# Patient Record
Sex: Female | Born: 1950
Health system: Southern US, Community
[De-identification: ages and names within clinical notes are randomized; demographics above are authoritative.]

## PROBLEM LIST (undated history)

## (undated) DIAGNOSIS — F17200 Nicotine dependence, unspecified, uncomplicated: Secondary | ICD-10-CM

## (undated) DIAGNOSIS — J449 Chronic obstructive pulmonary disease, unspecified: Secondary | ICD-10-CM

## (undated) DIAGNOSIS — F39 Unspecified mood [affective] disorder: Secondary | ICD-10-CM

## (undated) HISTORY — DX: Unspecified mood (affective) disorder: F39

## (undated) HISTORY — DX: Nicotine dependence, unspecified, uncomplicated: F17.200

---

## 1998-01-11 ENCOUNTER — Emergency Department (HOSPITAL_COMMUNITY): Admission: EM | Admit: 1998-01-11 | Discharge: 1998-01-11 | Payer: Self-pay | Admitting: Emergency Medicine

## 1998-01-11 ENCOUNTER — Emergency Department (HOSPITAL_COMMUNITY): Admission: EM | Admit: 1998-01-11 | Discharge: 1998-01-12 | Payer: Self-pay | Admitting: Family Medicine

## 1999-03-07 ENCOUNTER — Emergency Department (HOSPITAL_COMMUNITY): Admission: EM | Admit: 1999-03-07 | Discharge: 1999-03-07 | Payer: Self-pay | Admitting: *Deleted

## 2003-02-13 ENCOUNTER — Encounter: Admission: RE | Admit: 2003-02-13 | Discharge: 2003-02-13 | Payer: Self-pay | Admitting: Family Medicine

## 2003-11-14 ENCOUNTER — Other Ambulatory Visit (HOSPITAL_COMMUNITY): Admission: RE | Admit: 2003-11-14 | Discharge: 2003-11-28 | Payer: Self-pay | Admitting: Psychiatry

## 2004-02-01 ENCOUNTER — Emergency Department (HOSPITAL_COMMUNITY): Admission: EM | Admit: 2004-02-01 | Discharge: 2004-02-01 | Payer: Self-pay | Admitting: Emergency Medicine

## 2006-03-11 ENCOUNTER — Ambulatory Visit: Payer: Self-pay | Admitting: Family Medicine

## 2006-05-05 ENCOUNTER — Ambulatory Visit: Payer: Self-pay | Admitting: Family Medicine

## 2006-10-26 ENCOUNTER — Ambulatory Visit: Payer: Self-pay | Admitting: Family Medicine

## 2007-08-02 ENCOUNTER — Ambulatory Visit: Payer: Self-pay | Admitting: Family Medicine

## 2007-12-18 ENCOUNTER — Ambulatory Visit: Payer: Self-pay | Admitting: Family Medicine

## 2008-07-18 ENCOUNTER — Ambulatory Visit: Payer: Self-pay | Admitting: Family Medicine

## 2009-01-06 ENCOUNTER — Ambulatory Visit: Payer: Self-pay | Admitting: Family Medicine

## 2009-03-19 ENCOUNTER — Ambulatory Visit: Payer: Self-pay | Admitting: Family Medicine

## 2009-04-15 ENCOUNTER — Encounter: Admission: RE | Admit: 2009-04-15 | Discharge: 2009-04-15 | Payer: Self-pay | Admitting: Family Medicine

## 2009-04-15 ENCOUNTER — Ambulatory Visit: Payer: Self-pay | Admitting: Family Medicine

## 2009-06-02 ENCOUNTER — Ambulatory Visit: Payer: Self-pay | Admitting: Family Medicine

## 2010-02-24 ENCOUNTER — Ambulatory Visit
Admission: RE | Admit: 2010-02-24 | Discharge: 2010-02-24 | Payer: Self-pay | Source: Home / Self Care | Attending: Family Medicine | Admitting: Family Medicine

## 2010-11-04 ENCOUNTER — Encounter: Payer: Self-pay | Admitting: Family Medicine

## 2011-05-31 ENCOUNTER — Ambulatory Visit (INDEPENDENT_AMBULATORY_CARE_PROVIDER_SITE_OTHER): Payer: Medicare Other | Admitting: Family Medicine

## 2011-05-31 ENCOUNTER — Encounter: Payer: Self-pay | Admitting: Family Medicine

## 2011-05-31 DIAGNOSIS — J45909 Unspecified asthma, uncomplicated: Secondary | ICD-10-CM | POA: Diagnosis not present

## 2011-05-31 DIAGNOSIS — F17200 Nicotine dependence, unspecified, uncomplicated: Secondary | ICD-10-CM

## 2011-05-31 DIAGNOSIS — J209 Acute bronchitis, unspecified: Secondary | ICD-10-CM | POA: Diagnosis not present

## 2011-05-31 DIAGNOSIS — Z720 Tobacco use: Secondary | ICD-10-CM | POA: Insufficient documentation

## 2011-05-31 MED ORDER — ALBUTEROL SULFATE HFA 108 (90 BASE) MCG/ACT IN AERS: 2.0000 | INHALATION_SPRAY | Freq: Four times a day (QID) | RESPIRATORY_TRACT | Status: DC | PRN

## 2011-05-31 MED ORDER — AMOXICILLIN 875 MG PO TABS: 875.0000 mg | ORAL_TABLET | Freq: Two times a day (BID) | ORAL | Status: AC

## 2011-05-31 NOTE — Patient Instructions (Signed)
Call if not entirely better at the end of the antibiotic 

## 2011-05-31 NOTE — Progress Notes (Signed)
  Subjective:    Patient ID: Jessica Townsend, female    DOB: 04-25-1950, 61 y.o.   MRN: [id number]  HPI She complains of a one-week history of right-sided neck swelling that went diminished quite quickly as well as some ringing in her ears. She also has had difficulty with an intermittently productive cough and questionable fever and chills. She smokes one or 2 cigarettes per day per her history. She continues on Symbicort.   Review of Systems     Objective:   Physical Exam alert and in no distress. Tympanic membranes and canals are normal. Throat is clear. Tonsils are normal. Neck is supple without adenopathy or thyromegaly. Cardiac exam shows a regular sinus rhythm without murmurs or gallops. Lungs show scattered expiratory wheezing        Assessment & Plan:   1. Asthma  albuterol (PROVENTIL HFA;VENTOLIN HFA) 108 (90 BASE) MCG/ACT inhaler  2. Bronchitis, acute  amoxicillin (AMOXIL) 875 MG tablet  3. Current smoker     again encouraged her to quit smoking.

## 2011-09-03 ENCOUNTER — Other Ambulatory Visit: Payer: Self-pay | Admitting: Medical

## 2011-09-03 ENCOUNTER — Telehealth: Payer: Self-pay | Admitting: Medical

## 2011-09-03 MED ORDER — ALBUTEROL SULFATE HFA 108 (90 BASE) MCG/ACT IN AERS: 2.0000 | INHALATION_SPRAY | Freq: Four times a day (QID) | RESPIRATORY_TRACT | Status: DC | PRN

## 2011-09-03 NOTE — Telephone Encounter (Signed)
inahler rx sent.  If using on a regular basis, more than 2 x per week, needs to come in to discuss other treatment and better control of asthma.

## 2011-09-03 NOTE — Telephone Encounter (Signed)
I lmom notying the patien that her meds. Was sent to the pharmacy and she will need to contact us if asthma is not under control. CLS

## 2011-11-04 ENCOUNTER — Ambulatory Visit (INDEPENDENT_AMBULATORY_CARE_PROVIDER_SITE_OTHER): Payer: Medicare Other | Admitting: Family Medicine

## 2011-11-04 ENCOUNTER — Encounter: Payer: Self-pay | Admitting: Family Medicine

## 2011-11-04 VITALS — BP 120/70 | HR 64 | Wt 154.0 lb

## 2011-11-04 DIAGNOSIS — F17200 Nicotine dependence, unspecified, uncomplicated: Secondary | ICD-10-CM

## 2011-11-04 DIAGNOSIS — J45909 Unspecified asthma, uncomplicated: Secondary | ICD-10-CM

## 2011-11-04 DIAGNOSIS — Z23 Encounter for immunization: Secondary | ICD-10-CM

## 2011-11-04 MED ORDER — ALBUTEROL SULFATE HFA 108 (90 BASE) MCG/ACT IN AERS: 2.0000 | INHALATION_SPRAY | Freq: Four times a day (QID) | RESPIRATORY_TRACT | Status: DC | PRN

## 2011-11-04 MED ORDER — BUDESONIDE-FORMOTEROL FUMARATE 160-4.5 MCG/ACT IN AERO: 2.0000 | INHALATION_SPRAY | Freq: Two times a day (BID) | RESPIRATORY_TRACT | Status: DC

## 2011-11-04 NOTE — Progress Notes (Signed)
  Subjective:    Patient ID: Jessica Townsend, female    DOB: October 20, 1950, 61 y.o.   MRN: [id number]  HPI She is here for medication refill. She continues on her asthma medications. She also continues to smoke and has no desire to quit. She does base going outside on whether patterns.   Review of Systems     Objective:   Physical Exam alert and in no distress. Tympanic membranes and canals are normal. Throat is clear. Tonsils are normal. Neck is supple without adenopathy or thyromegaly. Cardiac exam shows a regular sinus rhythm without murmurs or gallops. Lungs are clear to auscultation.        Assessment & Plan:   1. Asthma  albuterol (PROAIR HFA) 108 (90 BASE) MCG/ACT inhaler, budesonide-formoterol (SYMBICORT) 160-4.5 MCG/ACT inhaler  2. Smoker     I again encouraged her to quit smoking.

## 2011-11-04 NOTE — Patient Instructions (Signed)
Quit smoking damn it!

## 2012-03-13 ENCOUNTER — Ambulatory Visit: Payer: Medicare Other | Admitting: Family Medicine

## 2012-05-04 ENCOUNTER — Encounter: Payer: Self-pay | Admitting: Family Medicine

## 2012-05-04 ENCOUNTER — Ambulatory Visit (INDEPENDENT_AMBULATORY_CARE_PROVIDER_SITE_OTHER): Payer: Medicare Other | Admitting: Family Medicine

## 2012-05-04 VITALS — HR 80 | Temp 98.3°F

## 2012-05-04 DIAGNOSIS — F17200 Nicotine dependence, unspecified, uncomplicated: Secondary | ICD-10-CM

## 2012-05-04 DIAGNOSIS — J45909 Unspecified asthma, uncomplicated: Secondary | ICD-10-CM

## 2012-05-04 DIAGNOSIS — J209 Acute bronchitis, unspecified: Secondary | ICD-10-CM | POA: Diagnosis not present

## 2012-05-04 MED ORDER — AMOXICILLIN-POT CLAVULANATE 875-125 MG PO TABS: 1.0000 | ORAL_TABLET | Freq: Two times a day (BID) | ORAL | Status: AC

## 2012-05-04 NOTE — Progress Notes (Signed)
  Subjective:    Patient ID: Jessica Townsend, female    DOB: 09/05/1950, 62 y.o.   MRN: [id number]  HPI She has a one-week history this started with swollen lymph nodes, sinus pressure and pain chest congestion with cough that has become productive with chills but no fever or sore throat or earache. She continues to smoke.   Review of Systems     Objective:   Physical Exam alert and in no distress. Tympanic membranes and canals are normal. Throat is clear. Tonsils are normal. Neck is supple without adenopathy or thyromegaly. Cardiac exam shows a regular sinus rhythm without murmurs or gallops. Lungs are clear to auscultation.        Assessment & Plan:  Acute bronchitis - Plan: amoxicillin-clavulanate (AUGMENTIN) 875-125 MG per tablet  Current smoker  Asthma I again encouraged her to quit smoking however she is not interested

## 2012-07-05 DIAGNOSIS — H04129 Dry eye syndrome of unspecified lacrimal gland: Secondary | ICD-10-CM | POA: Diagnosis not present

## 2012-07-05 DIAGNOSIS — H2510 Age-related nuclear cataract, unspecified eye: Secondary | ICD-10-CM | POA: Diagnosis not present

## 2012-07-05 DIAGNOSIS — H18599 Other hereditary corneal dystrophies, unspecified eye: Secondary | ICD-10-CM | POA: Diagnosis not present

## 2012-11-14 ENCOUNTER — Other Ambulatory Visit: Payer: Self-pay | Admitting: Family Medicine

## 2012-11-14 NOTE — Telephone Encounter (Signed)
Is this okay to refill? 

## 2012-12-17 ENCOUNTER — Other Ambulatory Visit: Payer: Self-pay | Admitting: Family Medicine

## 2013-02-19 ENCOUNTER — Encounter: Payer: Self-pay | Admitting: Family Medicine

## 2013-02-19 ENCOUNTER — Ambulatory Visit (INDEPENDENT_AMBULATORY_CARE_PROVIDER_SITE_OTHER): Payer: Medicare Other | Admitting: Family Medicine

## 2013-02-19 VITALS — BP 120/80 | HR 67 | Wt 149.0 lb

## 2013-02-19 DIAGNOSIS — B349 Viral infection, unspecified: Secondary | ICD-10-CM

## 2013-02-19 DIAGNOSIS — B9789 Other viral agents as the cause of diseases classified elsewhere: Secondary | ICD-10-CM

## 2013-02-19 NOTE — Progress Notes (Signed)
   Subjective:    Patient ID: Jessica Townsend, female    DOB: [birth year], 62 y.o.   MRN: [id number]  HPI She has a one-week history of right neck discomfort headache, fatigue, myalgias no sore throat, earache, coughing or chest congestion. She continues to smoke and is not interested in quitting.   Review of Systems     Objective:   Physical Exam alert and in no distress. Tympanic membranes and canals are normal. Throat is clear. Tonsils are normal. Neck is supple without adenopathy or thyromegaly. Cardiac exam shows a regular sinus rhythm without murmurs or gallops. Lungs are clear to auscultation.        Assessment & Plan:  Viral syndrome  reported care. Call if further difficulty

## 2013-02-19 NOTE — Patient Instructions (Signed)
Continue to use ibuprofen 800 mg 3 times per day. Call if further difficulty.

## 2013-02-27 ENCOUNTER — Encounter: Payer: Self-pay | Admitting: Family Medicine

## 2013-02-27 ENCOUNTER — Ambulatory Visit (INDEPENDENT_AMBULATORY_CARE_PROVIDER_SITE_OTHER): Payer: Medicare Other | Admitting: Family Medicine

## 2013-02-27 VITALS — HR 87 | Temp 98.8°F

## 2013-02-27 DIAGNOSIS — F17200 Nicotine dependence, unspecified, uncomplicated: Secondary | ICD-10-CM

## 2013-02-27 DIAGNOSIS — J45909 Unspecified asthma, uncomplicated: Secondary | ICD-10-CM

## 2013-02-27 DIAGNOSIS — J209 Acute bronchitis, unspecified: Secondary | ICD-10-CM | POA: Diagnosis not present

## 2013-02-27 MED ORDER — AMOXICILLIN-POT CLAVULANATE 875-125 MG PO TABS: 1.0000 | ORAL_TABLET | Freq: Two times a day (BID) | ORAL | Status: DC

## 2013-02-27 NOTE — Patient Instructions (Addendum)
Take 4 Advil 3 times per day for the pain and you can also take Tylenol if you need to. We'll call in some medication. Can you on the inhalers.

## 2013-02-27 NOTE — Progress Notes (Signed)
   Subjective:    Patient ID: Jessica Townsend, female    DOB: [birth year], 62 y.o.   MRN: [id number]  HPI 2 days ago her symptoms worsened with waking up with headaches, sore throat and fever. She started having difficulty with a productive cough the next day. She also had worsening pleuritic type chest pain. She continues on her asthma medications. This has interfered with her normal smoking and she has not spoken several days.   Review of Systems     Objective:   Physical Exam alert and in no distress. Tympanic membranes and canals are normal. Throat is clear. Tonsils are normal. Neck is supple without adenopathy or thyromegaly. Cardiac exam shows a regular sinus rhythm without murmurs or gallops. Lungs are clear to auscultation. Chest x-ray shows no acute changes       Assessment & Plan:  Acute bronchitis - Plan: amoxicillin-clavulanate (AUGMENTIN) 875-125 MG per tablet  Smoker  Asthma  I will place her on Augmentin. Continue on asthma medications. Strongly suggested quitting smoking entirely. Call if further difficulty.

## 2013-06-13 ENCOUNTER — Encounter: Payer: Self-pay | Admitting: Medical

## 2013-06-13 ENCOUNTER — Ambulatory Visit (INDEPENDENT_AMBULATORY_CARE_PROVIDER_SITE_OTHER): Payer: Medicare Other | Admitting: Medical

## 2013-06-13 VITALS — BP 106/70 | HR 86 | Resp 16

## 2013-06-13 DIAGNOSIS — F17200 Nicotine dependence, unspecified, uncomplicated: Secondary | ICD-10-CM

## 2013-06-13 DIAGNOSIS — R0600 Dyspnea, unspecified: Secondary | ICD-10-CM

## 2013-06-13 DIAGNOSIS — J309 Allergic rhinitis, unspecified: Secondary | ICD-10-CM | POA: Diagnosis not present

## 2013-06-13 DIAGNOSIS — R0989 Other specified symptoms and signs involving the circulatory and respiratory systems: Secondary | ICD-10-CM

## 2013-06-13 DIAGNOSIS — J45909 Unspecified asthma, uncomplicated: Secondary | ICD-10-CM | POA: Diagnosis not present

## 2013-06-13 DIAGNOSIS — R0609 Other forms of dyspnea: Secondary | ICD-10-CM

## 2013-06-13 MED ORDER — ALBUTEROL SULFATE (2.5 MG/3ML) 0.083% IN NEBU: 2.5000 mg | INHALATION_SOLUTION | Freq: Once | RESPIRATORY_TRACT | Status: DC

## 2013-06-13 MED ORDER — ALBUTEROL SULFATE HFA 108 (90 BASE) MCG/ACT IN AERS: 2.0000 | INHALATION_SPRAY | Freq: Four times a day (QID) | RESPIRATORY_TRACT | Status: DC | PRN

## 2013-06-13 MED ORDER — IPRATROPIUM-ALBUTEROL 0.5-2.5 (3) MG/3ML IN SOLN: 3.0000 mL | RESPIRATORY_TRACT | Status: DC

## 2013-06-13 MED ORDER — BUDESONIDE-FORMOTEROL FUMARATE 160-4.5 MCG/ACT IN AERO: 2.0000 | INHALATION_SPRAY | Freq: Two times a day (BID) | RESPIRATORY_TRACT | Status: DC

## 2013-06-13 NOTE — Patient Instructions (Signed)
  Thank you for giving me the opportunity to serve you today.    Your diagnosis today includes: Encounter Diagnoses  Name Primary?  Marland Kitchen. Dyspnea Yes  . Unspecified asthma(493.90)   . Smoker   . Allergic rhinitis      Specific recommendations today include:  Continue Symbicort 2 puffs twice daily  Use albuterol inhaler 2 puffs every 4- 6 hours as needed  Consider using Zyrtec at bedtime daily for allergies  2-3 days with Dr. Susann GivensLalonde

## 2013-06-13 NOTE — Progress Notes (Signed)
Subjective:  Jessica Townsend is a 63 y.o. female who presents as a walk in for SOB.  She reports a history of asthma, typically gets flareups with pollen season this time a year, is a smoker.  She was fine 48 hours ago, but says when she went outside yesterday smelled an odd fume in the air and immediately started having breathing issues.  Since yesterday has been short of breath, and had difficulty breathing.  Has used her albuterol inhaler to the point where she ran out.  Is using Symbicort daily, has prior adverse effects with Advair.  Interestingly, she states she ran out of both symbicort and albuterol today.  No sick contacts.   She does smoke.   She does have a history of bronchitis and asthma.   Denies palpitations, lower extremity edema, fever, sore throat, sinus pressure, ear pain, rash.  She normally sees Dr. Susann GivensLalonde here and says in the past has come in for nebulizer treatments that have worked well.  No other aggravating or relieving factors.  No other c/o.  The following portions of the patient's history were reviewed and updated as appropriate: allergies, current medications, past family history, past medical history, past social history, past surgical history and problem list.  ROS as in subjective  Past Medical History  Diagnosis Date  . Asthma   . Smoker   . Mood disorder   . Trans-sexualism      Objective: BP 106/70  Pulse 86  Resp 16  SpO2 97%   General appearance: Alert, WD/WN, dyspneic appearing tachypnea, respirations about 30 per minute                             Skin: warm, no rash, no diaphoresis                           Head: no sinus tenderness                            Eyes: conjunctiva normal, corneas clear, PERRLA                            Ears: pearly TMs, external ear canals normal                          Nose: septum midline, turbinates swollen, with erythema and clear discharge             Mouth/throat: MMM, tongue normal, no pharyngeal  erythema                           Neck: supple, no adenopathy, no thyromegaly, nontender                          Heart: RRR, normal S1, S2, no murmurs                         Lungs: +bronchial breath sounds, +scattered rhonchi, positive scattered wheezes, no rales                Extremities: no edema, nontender     Assessment: Encounter Diagnoses  Name Primary?  Marland Kitchen. Dyspnea Yes  . Unspecified asthma(493.90)   . Smoker   .  Allergic rhinitis       Plan: Ettiology today would suggest allergic asthma or pneumonitis given some chemical fume inhalation yesterday.  Upon initial evaluation and brief exam started him on albuterol nebulizer time 1.  We ended up doing a second treatment with DuoNeb.  He did get considerable relief at this.  I suspect he has been out of his albuterol and Symbicort although he says he just ran out today.  He refuses prednisone steroid or antihistamine for allergies or for symptoms. He states that he has had numerous prior allergies although he can't specifically give me any to list in the chart, for example drug allergies.  I gave him a Symbicort sample, sent albuterol and Symbicort to his pharmacy. Advised he begin Zyrtec at bedtime over-the-counter.  Advise he followup here with Dr. Susann GivensLalonde in 2-3 days for recheck, call 911 if not improving.  He was much improved by the time of discharge.

## 2013-07-13 ENCOUNTER — Telehealth: Payer: Self-pay | Admitting: Family Medicine

## 2013-07-13 NOTE — Telephone Encounter (Signed)
Pt was called and informed that [facility] would not fill out form because he did not determine her disability.

## 2013-07-13 NOTE — Telephone Encounter (Signed)
Pt dropped off a form to be completed. She states that she is trying to get her taxes reduced on her home. She states that she needs to have this form completed. She states that she has already been to court and had it adjusted but she needs a signed doctor statement in order to complete. I am sending back on her chart. Please call pt at [phone] when complete.

## 2013-07-16 ENCOUNTER — Telehealth: Payer: Self-pay | Admitting: Family Medicine

## 2013-07-16 ENCOUNTER — Encounter: Payer: Self-pay | Admitting: Family Medicine

## 2013-07-16 NOTE — Telephone Encounter (Signed)
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  Done

## 2013-07-25 ENCOUNTER — Ambulatory Visit (INDEPENDENT_AMBULATORY_CARE_PROVIDER_SITE_OTHER): Payer: Medicare Other | Admitting: Family Medicine

## 2013-07-25 ENCOUNTER — Encounter: Payer: Self-pay | Admitting: Family Medicine

## 2013-07-25 VITALS — BP 100/72 | HR 80 | Wt 141.0 lb

## 2013-07-25 DIAGNOSIS — J209 Acute bronchitis, unspecified: Secondary | ICD-10-CM

## 2013-07-25 MED ORDER — AMOXICILLIN-POT CLAVULANATE 875-125 MG PO TABS: 1.0000 | ORAL_TABLET | Freq: Two times a day (BID) | ORAL | Status: DC

## 2013-07-25 NOTE — Progress Notes (Signed)
   Subjective:    Patient ID: Jessica Townsend, female    DOB: [birth year], 63 y.o.   MRN: [id number]  HPI One week ago she developed coughing but no fever chills, sore throat. Next day he had a developed after arranging which usually causes headache. Also developed sinus congestion with PND. The postnasal drainage became purulent. She also then had difficulty with productive cough and chest tightness and had been using the inhaler regularly. She continues to smoke,no interest in quitting but states she usually smokes 4 or 5 cigarettes per day.   Review of Systems     Objective:   Physical Exam alert and in no distress. Tympanic membranes and canals are normal. Throat is clear. Tonsils are normal. Neck is supple without adenopathy or thyromegaly. Cardiac exam shows a regular sinus rhythm without murmurs or gallops. Lungs show scattered wheezing.        Assessment & Plan:  Acute bronchitis - Plan: amoxicillin-clavulanate (AUGMENTIN) 875-125 MG per tablet  she will call if no improvement. Again strongly encouraged smoking entirely.

## 2013-10-07 ENCOUNTER — Other Ambulatory Visit: Payer: Self-pay | Admitting: Family Medicine

## 2014-01-01 ENCOUNTER — Other Ambulatory Visit: Payer: Self-pay | Admitting: Family Medicine

## 2014-01-01 NOTE — Telephone Encounter (Signed)
Is this okay?

## 2014-01-17 ENCOUNTER — Ambulatory Visit (INDEPENDENT_AMBULATORY_CARE_PROVIDER_SITE_OTHER): Payer: Medicare Other | Admitting: Family Medicine

## 2014-01-17 VITALS — BP 125/80 | HR 90 | Temp 98.3°F

## 2014-01-17 DIAGNOSIS — J4530 Mild persistent asthma, uncomplicated: Secondary | ICD-10-CM

## 2014-01-17 DIAGNOSIS — Z720 Tobacco use: Secondary | ICD-10-CM

## 2014-01-17 DIAGNOSIS — F17200 Nicotine dependence, unspecified, uncomplicated: Secondary | ICD-10-CM

## 2014-01-17 DIAGNOSIS — J209 Acute bronchitis, unspecified: Secondary | ICD-10-CM

## 2014-01-17 MED ORDER — AMOXICILLIN-POT CLAVULANATE 875-125 MG PO TABS: 1.0000 | ORAL_TABLET | Freq: Two times a day (BID) | ORAL | Status: DC

## 2014-01-17 NOTE — Progress Notes (Signed)
Subjective:     Patient ID: Jessica Townsend, female   DOB: Apr 16, 1950, 63 y.o.   MRN: [id number]  HPI  This is a 63 year old transgendered female who presents with shortness of breath.  One month ago she developed coughing, sneezing, and sinus pain with intermittent headaches.  The sneezing, sinus pain, and headaches have improved but approximately two weeks she developed productive cough and wheezing.  Sore throat or earache Her symptoms acutely worsened overnight, and she presents for nebulizer treatment.  In the past she has taken amoxicillin-clavanulate for these episodes.  She continues to smoke daily.   Review of Systems Denies significant chest pain,     Objective:   Physical Exam  General: Pleasant with wheezing and respiratory difficulty. HEENT: Tympanic membranes clear, Conjunctivae clear, non-injected, nasal turbinates non-erythematous, moist.  Oropharynx non-erythematous without purulence.  Palpable nodule in anterior triangle on the R, directly overlying carotid pulse at the mandible. CV: RRR without murmurs, gallops, or rubs.  No JVD. Pulm:   inspiratory & expiratory wheezes, worse on the right. Peripheral: No cyanosis or clubbing of the distal phalanges.      Assessment:     Her current symptoms are consistent with an acute bacterial bronchitis in the setting of a URI.  In the setting of her chronic COPD, this is causing her significant dyspnea.  She has responded to antibiotics in the past.  She would also benefit from nebulizer treatment here in the office. He was given a neck shaft dose of Symbicort here and instructed to use 2 doses tonight.    Plan:     Asthma, mild persistent, uncomplicated  Acute bronchitis, unspecified organism  Smoker  Rx 10 day course of amoxicillin-clavanulate Nebulizer treatments in the office today did improve her pulmonary function however she was still having some difficulty with wheezing. Further discussion with her indicates she  was only using Symbicort 1 puff twice per day. She is to increase that to 2 puffs twice per day and use the albuterol on an as-needed basis. If she runs into difficulty she will call or come to the emergency room. Over 45 minutes spent discussing all these issues and having her nebulizer treatments.  - C. Merlyn Lotaylor Susana Gripp, MS3, [facility] Seen with Dr. Ronnald NianJohn C. Lalonde who is in agreement with the above findings and plan.

## 2014-02-26 ENCOUNTER — Telehealth: Payer: Self-pay | Admitting: Family Medicine

## 2014-02-26 MED ORDER — ALBUTEROL SULFATE HFA 108 (90 BASE) MCG/ACT IN AERS: INHALATION_SPRAY | RESPIRATORY_TRACT | Status: DC

## 2014-02-26 MED ORDER — BUDESONIDE-FORMOTEROL FUMARATE 160-4.5 MCG/ACT IN AERO: 2.0000 | INHALATION_SPRAY | Freq: Two times a day (BID) | RESPIRATORY_TRACT | Status: DC

## 2014-02-26 NOTE — Telephone Encounter (Signed)
meds called in.

## 2014-06-24 ENCOUNTER — Other Ambulatory Visit: Payer: Self-pay | Admitting: Family Medicine

## 2014-08-01 ENCOUNTER — Other Ambulatory Visit: Payer: Self-pay | Admitting: Family Medicine

## 2014-08-02 ENCOUNTER — Ambulatory Visit (INDEPENDENT_AMBULATORY_CARE_PROVIDER_SITE_OTHER): Payer: Medicare Other | Admitting: Medical

## 2014-08-02 ENCOUNTER — Other Ambulatory Visit: Payer: Self-pay | Admitting: Medical

## 2014-08-02 ENCOUNTER — Ambulatory Visit
Admission: RE | Admit: 2014-08-02 | Discharge: 2014-08-02 | Disposition: A | Discharge status: Home / Self Care | Payer: Medicare Other | Source: Ambulatory Visit | Attending: Medical | Admitting: Medical

## 2014-08-02 ENCOUNTER — Encounter: Payer: Self-pay | Admitting: Medical

## 2014-08-02 VITALS — BP 120/70 | HR 80 | Temp 98.2°F | Resp 20

## 2014-08-02 DIAGNOSIS — R062 Wheezing: Secondary | ICD-10-CM

## 2014-08-02 DIAGNOSIS — J439 Emphysema, unspecified: Secondary | ICD-10-CM | POA: Diagnosis not present

## 2014-08-02 DIAGNOSIS — J4541 Moderate persistent asthma with (acute) exacerbation: Secondary | ICD-10-CM

## 2014-08-02 DIAGNOSIS — F17200 Nicotine dependence, unspecified, uncomplicated: Secondary | ICD-10-CM

## 2014-08-02 DIAGNOSIS — Z720 Tobacco use: Secondary | ICD-10-CM

## 2014-08-02 MED ORDER — AZITHROMYCIN 250 MG PO TABS: ORAL_TABLET | ORAL | Status: DC

## 2014-08-02 MED ORDER — PREDNISONE 20 MG PO TABS: ORAL_TABLET | ORAL | Status: DC

## 2014-08-02 MED ORDER — ALBUTEROL SULFATE (2.5 MG/3ML) 0.083% IN NEBU
2.5000 mg | INHALATION_SOLUTION | Freq: Four times a day (QID) | RESPIRATORY_TRACT | Status: DC | PRN
Start: 2014-08-02 — End: 2014-08-30

## 2014-08-02 NOTE — Progress Notes (Signed)
Subjective:  Jessica Townsend is a 64 y.o. female who presents as a walk in for SOB.  She reports a history of asthma, is a smoker, compliant with Symbicort BID, albuterol somewhat regularly.  Started getting SOB the last 3-4 days not improved with home regimen, also has had cough, productive cough, some chest congestion.  No fever, no sore throat, ear pain, sinus pressure, no hemoptysis.   No sick contacts.   She does smoke.   She does have a history of bronchitis and asthma.   Denies palpitations, lower extremity edema, fever, sore throat, sinus pressure, ear pain, rash.  She normally sees Dr. Susann GivensLalonde here and says in the past has come in for nebulizer treatments that have worked well.  No other aggravating or relieving factors.  No other c/o.  The following portions of the patient's history were reviewed and updated as appropriate: allergies, current medications, past family history, past medical history, past social history, past surgical history and problem list.  ROS as in subjective  Past Medical History  Diagnosis Date  . Asthma   . Smoker   . Mood disorder   . Trans-sexualism      Objective: BP 120/70 mmHg  Pulse 80  Temp(Src) 98.2 F (36.8 C) (Oral)  Resp 20  SpO2 97%   General appearance: Alert, WD/WN, dyspneic appearing tachypnea, respirations about 30 per minute                             Skin: warm, no rash, no diaphoresis                           Head: no sinus tenderness                            Eyes: conjunctiva normal, corneas clear, PERRLA                            Ears: pearly TMs, external ear canals normal                          Nose: septum midline, turbinates swollen, with erythema and clear discharge             Mouth/throat: MMM, tongue normal, no pharyngeal erythema                           Neck: supple, no adenopathy, no thyromegaly, nontender                          Heart: RRR, normal S1, S2, no murmurs                         Lungs:  +bronchial breath sounds, +scattered rhonchi, positive scattered wheezes, no rales                Extremities: no edema, nontender     Per nurse Albuterol sulfate inhalation solution 0.083% Lot [id number], EXP 12/16 [facility] [id number], ipratropium Bromide 0.5mg  and Albuterol Sulfate 3mg   L[facility] [id number] [facility] [id number]   Assessment: Encounter Diagnoses  Name Primary?  Marland Kitchen. Asthma with acute exacerbation, moderate persistent Yes  . Wheezing   . Smoker  Plan: After initial exam gave 1 round of albuterol by nebulizer with no improvement.  After about 15 minutes gave second nebulized treatment with Duoneb.  This opened up the lungs some, helped some.  She declines shot of steroid for inflammation.  Will send for CXR.   Of note, she has a home nebulized machine that she has never used but bought it used several years ago.   She does have some reservations about long term use of nebulized/stigma of worsening disease.  Will call pending xray and additional recommendations likely to include steroid taper, nebulized albuterol at home over the weekend, possible antibiotic, and c/t Symbicort and Albuterol.  If worse over the weekend, go to the ED.

## 2014-08-06 ENCOUNTER — Encounter: Payer: Self-pay | Admitting: Family Medicine

## 2014-08-06 ENCOUNTER — Ambulatory Visit (INDEPENDENT_AMBULATORY_CARE_PROVIDER_SITE_OTHER): Payer: Medicare Other | Admitting: Family Medicine

## 2014-08-06 VITALS — BP 92/60 | HR 112 | Resp 15 | Wt 148.0 lb

## 2014-08-06 DIAGNOSIS — R062 Wheezing: Secondary | ICD-10-CM | POA: Diagnosis not present

## 2014-08-06 DIAGNOSIS — J4541 Moderate persistent asthma with (acute) exacerbation: Secondary | ICD-10-CM

## 2014-08-06 NOTE — Progress Notes (Signed)
   Subjective:    Patient ID: Jessica Townsend, female    DOB: 1950/06/06, 64 y.o.   MRN: [id number]  HPI She is here for a recheck. She is doing much better but still having some wheezing. She is using the nebulizer twice per day as well as her steroidal inhaler. She does note that the use of the nebulizer does cause her to become slightly hyper and it doesn't appear with sleep when she uses it later in the day.   Review of Systems     Objective:   Physical Exam Alert and in no distress. Lung exam does show slight inspiratory and expiratory wheezing.       Assessment & Plan:  Asthma with acute exacerbation, moderate persistent  Wheezing Discussed the use of the nebulizer. Tried to get her to use it at least 3 times per day. I doubt she can go to 4 times a day since closer to bedtime interferes with her sleep. I tried to explain that she doesn't really know what her baseline is in that she's not using the nebulizer to the full extent possible.

## 2014-08-30 ENCOUNTER — Telehealth: Payer: Self-pay | Admitting: Family Medicine

## 2014-08-30 MED ORDER — ALBUTEROL SULFATE (2.5 MG/3ML) 0.083% IN NEBU: 2.5000 mg | INHALATION_SOLUTION | Freq: Four times a day (QID) | RESPIRATORY_TRACT | Status: DC | PRN

## 2014-08-30 MED ORDER — BUDESONIDE-FORMOTEROL FUMARATE 160-4.5 MCG/ACT IN AERO: INHALATION_SPRAY | RESPIRATORY_TRACT | Status: DC

## 2014-08-30 NOTE — Telephone Encounter (Signed)
Sent, recently had OV with [facility] and she does use these.

## 2014-08-30 NOTE — Telephone Encounter (Signed)
Pt needs refills Symbicort  [facility]5 mg[facility]

## 2014-10-31 ENCOUNTER — Telehealth: Payer: Self-pay

## 2014-10-31 NOTE — Telephone Encounter (Signed)
Called pt to see if she would like to come in for a med check due to she has never had one she only comes in for when her asthma is acting up she stated money was tight and she really didn't want to do that.

## 2014-11-05 ENCOUNTER — Encounter: Payer: Self-pay | Admitting: Family Medicine

## 2014-11-05 ENCOUNTER — Ambulatory Visit (INDEPENDENT_AMBULATORY_CARE_PROVIDER_SITE_OTHER): Payer: Medicare Other | Admitting: Family Medicine

## 2014-11-05 VITALS — BP 100/70 | HR 99 | Temp 98.7°F | Wt 139.0 lb

## 2014-11-05 DIAGNOSIS — R062 Wheezing: Secondary | ICD-10-CM | POA: Diagnosis not present

## 2014-11-05 DIAGNOSIS — R069 Unspecified abnormalities of breathing: Secondary | ICD-10-CM | POA: Diagnosis not present

## 2014-11-05 DIAGNOSIS — J209 Acute bronchitis, unspecified: Secondary | ICD-10-CM

## 2014-11-05 DIAGNOSIS — Z720 Tobacco use: Secondary | ICD-10-CM

## 2014-11-05 DIAGNOSIS — J4541 Moderate persistent asthma with (acute) exacerbation: Secondary | ICD-10-CM

## 2014-11-05 DIAGNOSIS — F17200 Nicotine dependence, unspecified, uncomplicated: Secondary | ICD-10-CM

## 2014-11-05 MED ORDER — AMOXICILLIN-POT CLAVULANATE 875-125 MG PO TABS: 1.0000 | ORAL_TABLET | Freq: Two times a day (BID) | ORAL | Status: DC

## 2014-11-05 NOTE — Progress Notes (Signed)
   Subjective:    Patient ID: Jessica Townsend, female    DOB: [birth year], 64 y.o.   MRN: [id number]  HPI Jessica Townsend is here for evaluation of a several day history this started with a slight sore throat followed by fever, cough and congestion. Apparently within the last day the symptoms of gotten worse since she's had more difficulty with cough, congestion and has been using the nebulizer treatment.She continues to smoke.   Review of Systems     Objective:   Physical Exam Alert and in no distress. Tympanic membranes and canals are normal. Pharyngeal area is normal. Neck is supple without adenopathy or thyromegaly. Cardiac exam shows a regular sinus rhythm without murmurs or gallops. Lungs Show inspiratory as well as expiratory wheezing.        Assessment & Plan:  Asthma with acute exacerbation, moderate persistent  Wheezing  Smoker  Acute bronchitis, unspecified organism 2 nebulizer treatments given. Inadequate response was obtained. Recommended going to the emergency room for further care. Did explain I will call an antibiotic in.

## 2014-11-06 ENCOUNTER — Inpatient Hospital Stay (HOSPITAL_COMMUNITY): Payer: Medicare Other

## 2014-11-06 ENCOUNTER — Inpatient Hospital Stay (HOSPITAL_COMMUNITY)
Admission: EM | Admit: 2014-11-06 | Discharge: 2014-11-09 | DRG: 190 | Disposition: A | Discharge status: Home / Self Care | Payer: Medicare Other | Attending: Internal Medicine | Admitting: Internal Medicine

## 2014-11-06 ENCOUNTER — Encounter (HOSPITAL_COMMUNITY): Payer: Self-pay | Admitting: Emergency Medicine

## 2014-11-06 ENCOUNTER — Emergency Department (HOSPITAL_COMMUNITY): Payer: Medicare Other

## 2014-11-06 DIAGNOSIS — R0600 Dyspnea, unspecified: Secondary | ICD-10-CM | POA: Diagnosis not present

## 2014-11-06 DIAGNOSIS — J209 Acute bronchitis, unspecified: Secondary | ICD-10-CM | POA: Diagnosis present

## 2014-11-06 DIAGNOSIS — N179 Acute kidney failure, unspecified: Secondary | ICD-10-CM | POA: Diagnosis present

## 2014-11-06 DIAGNOSIS — J9601 Acute respiratory failure with hypoxia: Secondary | ICD-10-CM | POA: Diagnosis present

## 2014-11-06 DIAGNOSIS — J440 Chronic obstructive pulmonary disease with acute lower respiratory infection: Secondary | ICD-10-CM | POA: Diagnosis present

## 2014-11-06 DIAGNOSIS — F17210 Nicotine dependence, cigarettes, uncomplicated: Secondary | ICD-10-CM | POA: Diagnosis present

## 2014-11-06 DIAGNOSIS — F17200 Nicotine dependence, unspecified, uncomplicated: Secondary | ICD-10-CM | POA: Diagnosis not present

## 2014-11-06 DIAGNOSIS — J45901 Unspecified asthma with (acute) exacerbation: Secondary | ICD-10-CM | POA: Diagnosis present

## 2014-11-06 DIAGNOSIS — R0602 Shortness of breath: Secondary | ICD-10-CM | POA: Diagnosis not present

## 2014-11-06 DIAGNOSIS — J441 Chronic obstructive pulmonary disease with (acute) exacerbation: Secondary | ICD-10-CM | POA: Diagnosis not present

## 2014-11-06 DIAGNOSIS — R000 Tachycardia, unspecified: Secondary | ICD-10-CM | POA: Diagnosis present

## 2014-11-06 DIAGNOSIS — J989 Respiratory disorder, unspecified: Secondary | ICD-10-CM

## 2014-11-06 DIAGNOSIS — N19 Unspecified kidney failure: Secondary | ICD-10-CM | POA: Diagnosis not present

## 2014-11-06 DIAGNOSIS — F641 Gender identity disorder in adolescence and adulthood: Secondary | ICD-10-CM | POA: Diagnosis present

## 2014-11-06 DIAGNOSIS — R739 Hyperglycemia, unspecified: Secondary | ICD-10-CM | POA: Diagnosis present

## 2014-11-06 DIAGNOSIS — R0603 Acute respiratory distress: Secondary | ICD-10-CM | POA: Diagnosis present

## 2014-11-06 DIAGNOSIS — R8299 Other abnormal findings in urine: Secondary | ICD-10-CM | POA: Diagnosis not present

## 2014-11-06 HISTORY — DX: Chronic obstructive pulmonary disease, unspecified: J44.9

## 2014-11-06 LAB — CBC WITH DIFFERENTIAL/PLATELET
BASOS ABS: 0 10*3/uL (ref 0.0–0.1)
BASOS PCT: 0 % (ref 0–1)
Basophils Absolute: 0 K/uL (ref 0.0–0.1)
Basophils Relative: 0 % (ref 0–1)
EOS ABS: 0 10*3/uL (ref 0.0–0.7)
Eosinophils Absolute: 0 K/uL (ref 0.0–0.7)
Eosinophils Relative: 0 % (ref 0–5)
Eosinophils Relative: 0 % (ref 0–5)
HCT: 40.4 % (ref 36.0–46.0)
HCT: 43.7 % (ref 36.0–46.0)
HEMOGLOBIN: 15 g/dL (ref 12.0–15.0)
Hemoglobin: 13.7 g/dL (ref 12.0–15.0)
Lymphocytes Relative: 18 % (ref 12–46)
Lymphocytes Relative: 5 % — ABNORMAL LOW (ref 12–46)
Lymphs Abs: 0.4 K/uL — ABNORMAL LOW (ref 0.7–4.0)
Lymphs Abs: 1.6 10*3/uL (ref 0.7–4.0)
MCH: 30.6 pg (ref 26.0–34.0)
MCH: 31.1 pg (ref 26.0–34.0)
MCHC: 33.9 g/dL (ref 30.0–36.0)
MCHC: 34.3 g/dL (ref 30.0–36.0)
MCV: 90.4 fL (ref 78.0–100.0)
MCV: 90.7 fL (ref 78.0–100.0)
MONOS PCT: 10 % (ref 3–12)
Monocytes Absolute: 0.1 K/uL (ref 0.1–1.0)
Monocytes Absolute: 0.9 10*3/uL (ref 0.1–1.0)
Monocytes Relative: 1 % — ABNORMAL LOW (ref 3–12)
NEUTROS PCT: 72 % (ref 43–77)
Neutro Abs: 6.4 10*3/uL (ref 1.7–7.7)
Neutro Abs: 7.9 K/uL — ABNORMAL HIGH (ref 1.7–7.7)
Neutrophils Relative %: 94 % — ABNORMAL HIGH (ref 43–77)
Platelets: 246 10*3/uL (ref 150–400)
Platelets: 252 K/uL (ref 150–400)
RBC: 4.47 MIL/uL (ref 3.87–5.11)
RBC: 4.82 MIL/uL (ref 3.87–5.11)
RDW: 13.7 % (ref 11.5–15.5)
RDW: 13.9 % (ref 11.5–15.5)
WBC: 8.5 K/uL (ref 4.0–10.5)
WBC: 9 10*3/uL (ref 4.0–10.5)

## 2014-11-06 LAB — URINALYSIS, ROUTINE W REFLEX MICROSCOPIC
Bilirubin Urine: NEGATIVE
Glucose, UA: NEGATIVE mg/dL
Hgb urine dipstick: NEGATIVE
Ketones, ur: 15 mg/dL — AB
Leukocytes, UA: NEGATIVE
Nitrite: NEGATIVE
Protein, ur: NEGATIVE mg/dL
Specific Gravity, Urine: 1.019 (ref 1.005–1.030)
Urobilinogen, UA: 0.2 mg/dL (ref 0.0–1.0)
pH: 5 (ref 5.0–8.0)

## 2014-11-06 LAB — MRSA PCR SCREENING: MRSA by PCR: NEGATIVE

## 2014-11-06 LAB — I-STAT VENOUS BLOOD GAS, ED
Acid-base deficit: 3 mmol/L — ABNORMAL HIGH (ref 0.0–2.0)
Bicarbonate: 25.2 mEq/L — ABNORMAL HIGH (ref 20.0–24.0)
O2 Saturation: 85 %
PCO2 VEN: 55.2 mmHg — AB (ref 45.0–50.0)
PH VEN: 7.269 (ref 7.250–7.300)
TCO2: 27 mmol/L (ref 0–100)
pO2, Ven: 58 mmHg — ABNORMAL HIGH (ref 30.0–45.0)

## 2014-11-06 LAB — COMPREHENSIVE METABOLIC PANEL
ALBUMIN: 3.8 g/dL (ref 3.5–5.0)
ALT: 18 U/L (ref 14–54)
ANION GAP: 15 (ref 5–15)
AST: 35 U/L (ref 15–41)
Alkaline Phosphatase: 79 U/L (ref 38–126)
BUN: 11 mg/dL (ref 6–20)
CHLORIDE: 102 mmol/L (ref 101–111)
CO2: 19 mmol/L — AB (ref 22–32)
Calcium: 8.7 mg/dL — ABNORMAL LOW (ref 8.9–10.3)
Creatinine, Ser: 1.46 mg/dL — ABNORMAL HIGH (ref 0.44–1.00)
GFR calc non Af Amer: 37 mL/min — ABNORMAL LOW (ref >60)
GFR, EST AFRICAN AMERICAN: 43 mL/min — AB (ref >60)
GLUCOSE: 159 mg/dL — AB (ref 65–99)
Potassium: 3.8 mmol/L (ref 3.5–5.1)
SODIUM: 136 mmol/L (ref 135–145)
Total Bilirubin: 0.3 mg/dL (ref 0.3–1.2)
Total Protein: 6.9 g/dL (ref 6.5–8.1)

## 2014-11-06 LAB — BASIC METABOLIC PANEL
ANION GAP: 13 (ref 5–15)
BUN: 11 mg/dL (ref 6–20)
CALCIUM: 9 mg/dL (ref 8.9–10.3)
CHLORIDE: 102 mmol/L (ref 101–111)
CO2: 22 mmol/L (ref 22–32)
CREATININE: 1.55 mg/dL — AB (ref 0.44–1.00)
GFR calc non Af Amer: 35 mL/min — ABNORMAL LOW (ref >60)
GFR, EST AFRICAN AMERICAN: 40 mL/min — AB (ref >60)
Glucose, Bld: 148 mg/dL — ABNORMAL HIGH (ref 65–99)
Potassium: 3.8 mmol/L (ref 3.5–5.1)
SODIUM: 137 mmol/L (ref 135–145)

## 2014-11-06 LAB — TROPONIN I
TROPONIN I: 0.07 ng/mL — AB (ref <0.031)
Troponin I: <0.03 ng/mL
Troponin I: <0.03 ng/mL (ref <0.031)

## 2014-11-06 LAB — SODIUM, URINE, RANDOM: Sodium, Ur: 54 mmol/L

## 2014-11-06 LAB — TSH: TSH: 0.58 u[IU]/mL (ref 0.350–4.500)

## 2014-11-06 LAB — D-DIMER, QUANTITATIVE: D-Dimer, Quant: 0.39 ug/mL-FEU (ref 0.00–0.48)

## 2014-11-06 MED ORDER — METHYLPREDNISOLONE SODIUM SUCC 125 MG IJ SOLR
80.0000 mg | Freq: Three times a day (TID) | INTRAMUSCULAR | Status: DC
  Administered 2014-11-06 – 2014-11-09 (×10): 80 mg via INTRAVENOUS
  Filled 2014-11-06 (×10): qty 2

## 2014-11-06 MED ORDER — LEVOFLOXACIN IN D5W 750 MG/150ML IV SOLN
750.0000 mg | Freq: Once | INTRAVENOUS | Status: AC
  Administered 2014-11-06: 750 mg via INTRAVENOUS
  Filled 2014-11-06: qty 150

## 2014-11-06 MED ORDER — ACETAMINOPHEN 650 MG RE SUPP: 650.0000 mg | Freq: Four times a day (QID) | RECTAL | Status: DC | PRN

## 2014-11-06 MED ORDER — SODIUM CHLORIDE 0.9 % IV BOLUS (SEPSIS)
1000.0000 mL | Freq: Once | INTRAVENOUS | Status: AC
  Administered 2014-11-06: 1000 mL via INTRAVENOUS

## 2014-11-06 MED ORDER — IPRATROPIUM BROMIDE 0.02 % IN SOLN
1.0000 mg | Freq: Once | RESPIRATORY_TRACT | Status: AC
  Administered 2014-11-06: 1 mg via RESPIRATORY_TRACT

## 2014-11-06 MED ORDER — LEVOFLOXACIN IN D5W 750 MG/150ML IV SOLN: 750.0000 mg | INTRAVENOUS | Status: DC

## 2014-11-06 MED ORDER — BENZONATATE 100 MG PO CAPS
100.0000 mg | ORAL_CAPSULE | Freq: Three times a day (TID) | ORAL | Status: DC
  Administered 2014-11-06 – 2014-11-09 (×10): 100 mg via ORAL
  Filled 2014-11-06 (×10): qty 1

## 2014-11-06 MED ORDER — TIOTROPIUM BROMIDE MONOHYDRATE 18 MCG IN CAPS
18.0000 ug | ORAL_CAPSULE | Freq: Every day | RESPIRATORY_TRACT | Status: DC
  Administered 2014-11-07 – 2014-11-09 (×3): 18 ug via RESPIRATORY_TRACT
  Filled 2014-11-06 (×2): qty 5

## 2014-11-06 MED ORDER — ALBUTEROL SULFATE (2.5 MG/3ML) 0.083% IN NEBU
2.5000 mg | INHALATION_SOLUTION | Freq: Four times a day (QID) | RESPIRATORY_TRACT | Status: DC | PRN
  Filled 2014-11-06: qty 3

## 2014-11-06 MED ORDER — HYDROCOD POLST-CPM POLST ER 10-8 MG/5ML PO SUER
5.0000 mL | Freq: Two times a day (BID) | ORAL | Status: DC | PRN
  Administered 2014-11-09: 5 mL via ORAL
  Filled 2014-11-06: qty 5

## 2014-11-06 MED ORDER — MAGNESIUM SULFATE 2 GM/50ML IV SOLN
2.0000 g | Freq: Once | INTRAVENOUS | Status: AC
  Administered 2014-11-06: 2 g via INTRAVENOUS
  Filled 2014-11-06: qty 50

## 2014-11-06 MED ORDER — ALBUTEROL SULFATE (2.5 MG/3ML) 0.083% IN NEBU: 2.5000 mg | INHALATION_SOLUTION | RESPIRATORY_TRACT | Status: DC | PRN

## 2014-11-06 MED ORDER — SODIUM CHLORIDE 0.9 % IJ SOLN
3.0000 mL | Freq: Two times a day (BID) | INTRAMUSCULAR | Status: DC
  Administered 2014-11-06 – 2014-11-08 (×4): 3 mL via INTRAVENOUS

## 2014-11-06 MED ORDER — MORPHINE SULFATE (PF) 2 MG/ML IV SOLN
1.0000 mg | INTRAVENOUS | Status: DC | PRN
  Administered 2014-11-06: 1 mg via INTRAVENOUS
  Filled 2014-11-06: qty 1

## 2014-11-06 MED ORDER — ALBUTEROL SULFATE (2.5 MG/3ML) 0.083% IN NEBU
2.5000 mg | INHALATION_SOLUTION | Freq: Four times a day (QID) | RESPIRATORY_TRACT | Status: DC
  Administered 2014-11-06 – 2014-11-09 (×14): 2.5 mg via RESPIRATORY_TRACT
  Filled 2014-11-06 (×14): qty 3

## 2014-11-06 MED ORDER — ALBUTEROL (5 MG/ML) CONTINUOUS INHALATION SOLN
10.0000 mg/h | INHALATION_SOLUTION | RESPIRATORY_TRACT | Status: DC
  Administered 2014-11-06: 10 mg/h via RESPIRATORY_TRACT

## 2014-11-06 MED ORDER — SODIUM CHLORIDE 0.9 % IV SOLN
INTRAVENOUS | Status: AC
Start: 2014-11-06 — End: 2014-11-06
  Administered 2014-11-06: 06:00:00 via INTRAVENOUS

## 2014-11-06 MED ORDER — ACETAMINOPHEN 325 MG PO TABS: 650.0000 mg | ORAL_TABLET | Freq: Four times a day (QID) | ORAL | Status: DC | PRN

## 2014-11-06 MED ORDER — ENOXAPARIN SODIUM 40 MG/0.4ML ~~LOC~~ SOLN
40.0000 mg | Freq: Every day | SUBCUTANEOUS | Status: DC
  Administered 2014-11-06 – 2014-11-08 (×3): 40 mg via SUBCUTANEOUS
  Filled 2014-11-06 (×3): qty 0.4

## 2014-11-06 NOTE — ED Provider Notes (Signed)
CSN: [id number]     Arrival date & time 11/06/14  0012 History  This chart was scribed for Tomasita Crumble, MD by Tanda Rockers, ED Scribe. This patient was seen in room A02C/A02C and the patient's care was started at 12:14 AM.  Chief Complaint  Patient presents with  . Respiratory Distress   The history is provided by the patient. No language interpreter was used.     HPI Comments: Jessica Townsend is a 64 y.o. female brought in by ambulance, with hx COPD, pulmonary edema, and transsexual female (uses female pronouns) who presents to the Emergency Department complaining of worsening shortness of breath that occurred earlier today. Pt is also complaining of a tightness in his chest. Per EMS, pt's O2 sat was 89% on RA. She was given 8L duoneb treatment in route which brought saturation up to 98%. EMS also gave pt 5 albuterol, 0.5 Atrovent, and 125 mg Solumedrol. EMS reports that pt had a fever of 101 1 day ago. She was seen by PCP, Dr. Susann Givens, today for fever and shortness of breath. Pt was given 2 breathing treatments in the office without relief. She was prescribed Augmentin that she began taking tonight. Denies diaphoresis, nausea, vomiting, pain or swelling in lower extremities, or any other associated symptoms. Pt has never had to be intubated in the past. She states that BiPAP and CPAP does not work for her due to the fluid in her lungs.    Past Medical History  Diagnosis Date  . Asthma   . Smoker   . Mood disorder   . Trans-sexualism   . COPD (chronic obstructive pulmonary disease)    History reviewed. No pertinent past surgical history. Family History  Problem Relation Age of Onset  . Hypertension Mother   . Hypertension Father   . Stroke Father   . Ulcers Father   . Asthma Sister   . Diabetes Paternal Uncle   . Cancer Maternal Grandmother   . Asthma Maternal Grandfather    Social History  Substance Use Topics  . Smoking status: Current Every Day Smoker  . Smokeless  tobacco: None  . Alcohol Use: No   OB History    No data available     Review of Systems  10 Systems reviewed and all are negative for acute change except as noted in the HPI.   Allergies  Review of patient's allergies indicates no known allergies.  Home Medications   Prior to Admission medications   Medication Sig Start Date End Date Taking? Authorizing Provider  albuterol (PROVENTIL) (2.5 MG/3ML) 0.083% nebulizer solution Take 3 mLs (2.5 mg total) by nebulization every 6 (six) hours as needed for wheezing or shortness of breath. 08/30/14   [name], MD  amoxicillin-clavulanate (AUGMENTIN) 875-125 MG per tablet Take 1 tablet by mouth 2 (two) times daily. 11/05/14   [name], MD  budesonide-formoterol (SYMBICORT) 160-4.5 MCG/ACT inhaler INHALE 2 PUFFS INTO THE LUNGS 2 (TWO) TIMES DAILY. 08/30/14   [name], MD   Triage Vitals: Ht  (1.854 m)  Wt 139 lb (63.05 kg)  BMI 18.34 kg/m2  SpO2 98%   Physical Exam  Constitutional: She is oriented to person, place, and time. She appears well-developed and well-nourished. She appears distressed.  HENT:  Head: Normocephalic and atraumatic.  Nose: Nose normal.  Mouth/Throat: Oropharynx is clear and moist. No oropharyngeal exudate.  Eyes: Conjunctivae and EOM are normal. Pupils are equal, round, and reactive to light. No scleral icterus.  Neck: Normal range of motion. Neck supple. No JVD present. No tracheal deviation present. No thyromegaly present.  Cardiovascular: Regular rhythm and normal heart sounds.  Tachycardia present.  Exam reveals no gallop and no friction rub.   No murmur heard. Pulmonary/Chest: Tachypnea noted. She is in respiratory distress. She has wheezes. She exhibits no tenderness.  Increased work on breathing Use of accessory muscles  Abdominal: Soft. Bowel sounds are normal. She exhibits no distension and no mass. There is no tenderness. There is no rebound and no guarding.  Musculoskeletal: Normal  range of motion. She exhibits no edema or tenderness.  Lymphadenopathy:    She has no cervical adenopathy.  Neurological: She is alert and oriented to person, place, and time. No cranial nerve deficit. She exhibits normal muscle tone.  Skin: Skin is warm and dry. No rash noted. No erythema. No pallor.  Nursing note and vitals reviewed.   ED Course  Procedures (including critical care time)  DIAGNOSTIC STUDIES: Oxygen Saturation is 96% on RA, normal by my interpretation.    COORDINATION OF CARE: 12:17 AM-Discussed treatment plan which includes CXR, VBG, CBC, BMP, Blood culture,  with pt at bedside and pt agreed to plan.   Labs Review Labs Reviewed  BASIC METABOLIC PANEL - Abnormal; Notable for the following:    Glucose, Bld 148 (*)    Creatinine, Ser 1.55 (*)    GFR calc non Af Amer 35 (*)    GFR calc Af Amer 40 (*)    All other components within normal limits  CULTURE, BLOOD (ROUTINE X 2)  CULTURE, BLOOD (ROUTINE X 2)  CBC WITH DIFFERENTIAL/PLATELET  I-STAT VENOUS BLOOD GAS, ED    Imaging Review Dg Chest Port 1 View  11/06/2014   CLINICAL DATA:  COPD exacerbation.  Shortness of breath tonight.  EXAM: PORTABLE CHEST - 1 VIEW  COMPARISON:  08/02/2014  FINDINGS: Hyperinflation. Normal heart size and pulmonary vascularity. No focal airspace disease or consolidation in the lungs. No blunting of costophrenic angles. No pneumothorax. Mediastinal contours appear intact. Mild thoracic scoliosis convex towards the right.  IMPRESSION: Hyperinflation consistent with emphysema. No evidence of active pulmonary disease.   Electronically Signed   By: Burman Nieves M.D.   On: 11/06/2014 00:57   I have personally reviewed and evaluated these images and lab results as part of my medical decision-making.   EKG Interpretation   Date/Time:  Wednesday November 06 2014 00:57:50 EDT Ventricular Rate:  141 PR Interval:  115 QRS Duration: 84 QT Interval:  371 QTC Calculation: 568 R Axis:    76 Text Interpretation:  Sinus tachycardia Prolonged QT interval Artifact in  lead(s) I II III aVR aVL aVF V1 V2 V3 V4 V5 V6 Confirmed by Erroll Luna [phone]) on 11/06/2014 1:02:48 AM      MDM   Final diagnoses:  None   Patient presents emergency department for severe shortness of breath and respiratory distress. Per EMS, oxygen saturation was 89% on room air. Patient was in acute respiratory distress during my evaluation as well. He was immediately given a continuous albuterol treatment, ipratropium, stairways RD given by EMS, I have also ordered magnesium and IV fluids.  Chest x-ray will be obtained as he states he had a fever early in the week and is taking Augmentin for it. Blood cultures drawn, Patient was empirically given levoquin.  Patient now tachycardic from treatment to the 130s.  Treatment stopped.  I spoke with [facility] hospitalist who will admit to stepdown.  CRITICAL CARE Performed by: Tomasita Crumble   Total critical care time: - Respiratory Distress  Critical care time was exclusive of separately billable procedures and treating other patients.  Critical care was necessary to treat or prevent imminent or life-threatening deterioration.  Critical care was time spent personally by me on the following activities: development of treatment plan with patient and/or surrogate as well as nursing, discussions with consultants, evaluation of patient's response to treatment, examination of patient, obtaining history from patient or surrogate, ordering and performing treatments and interventions, ordering and review of laboratory studies, ordering and review of radiographic studies, pulse oximetry and re-evaluation of patient's condition.   I personally performed the services described in this documentation, which was scribed in my presence. The recorded information has been reviewed and is accurate.      Tomasita Crumble, MD 11/06/14 [phone]

## 2014-11-06 NOTE — Progress Notes (Addendum)
[facility] PROGRESS NOTE  Jessica Townsend ZOX:[id number] DOB: [birth year] DOA: 11/06/2014  PCP: Carollee Herter, MD  Brief HPI: 64 year old female who is a transsexual and prefers to be addressed is a female with a past medical history of COPD/asthma who continues to smoke tobacco, presented with 3 day history of shortness of breath, cough. Patient was admitted for management of acute bronchitis versus COPD exacerbation.  Past medical history:  Past Medical History  Diagnosis Date  . Asthma   . Smoker   . Mood disorder   . Trans-sexualism   . COPD (chronic obstructive pulmonary disease)     Consultants: None  Procedures:   Echocardiogram is pending  Antibiotics: Intravenous Levaquin 9/7  Subjective: Patient still feels short of breath. He complains of a lot of sharp chest pain in the lower part of his chest, especially with deep breathing. Has a dry cough. No nausea or vomiting currently. States that he is about "40%" better compared to how he was 3 days ago.  Objective: Vital Signs  Filed Vitals:   11/06/14 0205 11/06/14 0316 11/06/14 0437 11/06/14 0456  BP: 124/84     Pulse: 113     Temp:      TempSrc:      Resp: 24     Height:      Weight:   65.6 kg (144 lb 10 oz)   SpO2: 92% 95%  96%    Intake/Output Summary (Last 24 hours) at 11/06/14 0736 Last data filed at 11/06/14 0300  Gross per 24 hour  Intake   1200 ml  Output      0 ml  Net   1200 ml   Filed Weights   11/06/14 0017 11/06/14 0437  Weight: 63.05 kg (139 lb) 65.6 kg (144 lb 10 oz)    General appearance: alert, cooperative, appears stated age, no distress and Very anxious Head: Normocephalic, without obvious abnormality, atraumatic Resp: Diffuse bilateral wheezes with few crackles at the bases. No definite rhonchi. Cardio: regular rate and rhythm, S1, S2 normal, no murmur, click, rub or gallop GI: soft, non-tender; bowel sounds normal; no masses,  no organomegaly Neurologic: Alert  and oriented and 3. No focal neurological deficits are noted.  Lab Results:  Basic Metabolic Panel:  Recent Labs Lab 11/06/14 0037 11/06/14 0532  NA 137 136  K 3.8 3.8  CL 102 102  CO2 22 19*  GLUCOSE 148* 159*  BUN 11 11  CREATININE 1.55* 1.46*  CALCIUM 9.0 8.7*   Liver Function Tests:  Recent Labs Lab 11/06/14 0532  AST 35  ALT 18  ALKPHOS 79  BILITOT 0.3  PROT 6.9  ALBUMIN 3.8   CBC:  Recent Labs Lab 11/06/14 0037 11/06/14 0532  WBC 9.0 8.5  NEUTROABS 6.4 7.9*  HGB 15.0 13.7  HCT 43.7 40.4  MCV 90.7 90.4  PLT 246 252   Cardiac Enzymes:  Recent Labs Lab 11/06/14 0532  TROPONINI <0.03    Recent Results (from the past 240 hour(s))  MRSA PCR Screening     Status: None   Collection Time: 11/06/14  4:06 AM  Result Value Ref Range Status   MRSA by PCR NEGATIVE NEGATIVE Final    Comment:        The GeneXpert MRSA Assay (FDA approved for NASAL specimens only), is one component of a comprehensive MRSA colonization surveillance program. It is not intended to diagnose MRSA infection nor to guide or monitor treatment for MRSA infections.  Studies/Results: US Renal  11/06/2014   CLINICAL DATA:  64 year old female with acute renal failure.  EXAM: RENAL / URINARY TRACT ULTRASOUND COMPLETE  COMPARISON:  None.  FINDINGS: Right Kidney:  Length: 10 cm. Echogenicity within normal limits. No mass or hydronephrosis visualized.  Left Kidney:  Length: 10 cm. Echogenicity within normal limits. No mass or hydronephrosis visualized.  Bladder:  Appears normal for degree of bladder distention.  IMPRESSION: Normal renal ultrasound.   Electronically Signed   By: Elgie Collard M.D.   On: 11/06/2014 02:45   Dg Chest Port 1 View  11/06/2014   CLINICAL DATA:  COPD exacerbation.  Shortness of breath tonight.  EXAM: PORTABLE CHEST - 1 VIEW  COMPARISON:  08/02/2014  FINDINGS: Hyperinflation. Normal heart size and pulmonary vascularity. No focal airspace disease or  consolidation in the lungs. No blunting of costophrenic angles. No pneumothorax. Mediastinal contours appear intact. Mild thoracic scoliosis convex towards the right.  IMPRESSION: Hyperinflation consistent with emphysema. No evidence of active pulmonary disease.   Electronically Signed   By: Burman Nieves M.D.   On: 11/06/2014 00:57    Medications:  Scheduled: . albuterol  2.5 mg Nebulization Q6H  . benzonatate  100 mg Oral TID  . enoxaparin (LOVENOX) injection  40 mg Subcutaneous Daily  . [START ON 11/08/2014] levofloxacin (LEVAQUIN) IV  750 mg Intravenous Q48H  . methylPREDNISolone (SOLU-MEDROL) injection  80 mg Intravenous 3 times per day  . sodium chloride  3 mL Intravenous Q12H  . tiotropium  18 mcg Inhalation Daily   Continuous: . sodium chloride 75 mL/hr at 11/06/14 0539  . albuterol Stopped (11/06/14 0105)   ZOX:[id number] **OR** acetaminophen, albuterol, chlorpheniramine-HYDROcodone, morphine injection  Assessment/Plan:  Active Problems:   Respiratory distress   COPD exacerbation   Hyperglycemia   ARF (acute renal failure)    Acute respiratory failure with mild hypoxia Secondary to COPD exacerbation versus acute bronchitis. Patient mentions that he did have a fever 2 days ago. Still continues to wheeze quite a bit. Continue current treatment with nebulizer treatments, steroids and antibiotics. Smoking cessation counseling provided.  Acute COPD exacerbation/acute bronchitis Continue current management as outlined above. He will benefit from outpatient pulmonary function testing few weeks after he has improved. He should stop smoking.  Chest pain with Sinus tachycardia Likely secondary to the above. TSH is pending. Echocardiogram is pending. D-dimer is normal.  Mild acute renal failure Continue IV fluids. Renal ultrasound did not show any hydronephrosis. Echogenicity was normal. Monitor urine output.  Hyperglycemia No known history of diabetes. HbA1c is pending.  Anticipate some worsening in glycemic control with use of steroids. Continue to monitor for now.   DVT Prophylaxis: Lovenox    Code Status: Full code  Family Communication: Discussed with the patient. No family at bedside.  Disposition Plan: Will remain in step down for now.     LOS: 0 days   [facility]  [facility] Pager [phone] 11/06/2014, 7:36 AM  If 7PM-7AM, please contact night-coverage at [email or web address], password [facility]

## 2014-11-06 NOTE — H&P (Signed)
Jessica Townsend is an 64 y.o. female.    Jessica Townsend (pcp)  Chief Complaint: dyspnea HPI: 64 yo female with hx of Copd /Asthma,  Tobacco dep, apparently c/o sore throat starting 3 days ago,  And then he went to be that nite and on Monday am, woke up with dyspnea.  + wheezing.  + fever,  + cough (dry).  Pt went to see physician on Tuesday and pt was tx with albuterol breathing treatment and improved briefly but then tonight had worsening dyspnea. , and therefore patient came to ED for evaluation.  CXR negative for any acute process.  Pt will be admitted for  Copd exacerbation.    Past Medical History  Diagnosis Date  . Asthma   . Smoker   . Mood disorder   . Trans-sexualism   . COPD (chronic obstructive pulmonary disease)     History reviewed. No pertinent past surgical history.  Family History  Problem Relation Age of Onset  . Hypertension Mother   . Hypertension Father   . Stroke Father   . Ulcers Father   . Asthma Sister   . Diabetes Paternal Uncle   . Cancer Maternal Grandmother   . Asthma Maternal Grandfather   . Heart attack Father    Social History:  reports that she has been smoking Cigarettes.  She has a 4 pack-year smoking history. She does not have any smokeless tobacco history on file. She reports that she does not drink alcohol or use illicit drugs.  Allergies: No Known Allergies Medications reviewed   Results for orders placed or performed during the hospital encounter of 11/06/14 (from the past 48 hour(s))  CBC with Differential/Platelet     Status: None   Collection Time: 11/06/14 12:37 AM  Result Value Ref Range   WBC 9.0 4.0 - 10.5 K/uL   RBC 4.82 3.87 - 5.11 MIL/uL   Hemoglobin 15.0 12.0 - 15.0 g/dL   HCT 43.7 36.0 - 46.0 %   MCV 90.7 78.0 - 100.0 fL   MCH 31.1 26.0 - 34.0 pg   MCHC 34.3 30.0 - 36.0 g/dL   RDW 13.7 11.5 - 15.5 %   Platelets 246 150 - 400 K/uL   Neutrophils Relative % 72 43 - 77 %   Neutro Abs 6.4 1.7 - 7.7 K/uL   Lymphocytes  Relative 18 12 - 46 %   Lymphs Abs 1.6 0.7 - 4.0 K/uL   Monocytes Relative 10 3 - 12 %   Monocytes Absolute 0.9 0.1 - 1.0 K/uL   Eosinophils Relative 0 0 - 5 %   Eosinophils Absolute 0.0 0.0 - 0.7 K/uL   Basophils Relative 0 0 - 1 %   Basophils Absolute 0.0 0.0 - 0.1 K/uL  Basic metabolic panel     Status: Abnormal   Collection Time: 11/06/14 12:37 AM  Result Value Ref Range   Sodium 137 135 - 145 mmol/L   Potassium 3.8 3.5 - 5.1 mmol/L   Chloride 102 101 - 111 mmol/L   CO2 22 22 - 32 mmol/L   Glucose, Bld 148 (H) 65 - 99 mg/dL   BUN 11 6 - 20 mg/dL   Creatinine, Ser 1.55 (H) 0.44 - 1.00 mg/dL   Calcium 9.0 8.9 - 10.3 mg/dL   GFR calc non Af Amer 35 (L) >60 mL/min   GFR calc Af Amer 40 (L) >60 mL/min    Comment: (NOTE) The eGFR has been calculated using the CKD EPI equation. This calculation has  not been validated in all clinical situations. eGFR's persistently <60 mL/min signify possible Chronic Kidney Disease.    Anion gap 13 5 - 15  I-Stat venous blood gas, ED     Status: Abnormal   Collection Time: 11/06/14  1:24 AM  Result Value Ref Range   pH, Ven 7.269 7.250 - 7.300   pCO2, Ven 55.2 (H) 45.0 - 50.0 mmHg   pO2, Ven 58.0 (H) 30.0 - 45.0 mmHg   Bicarbonate 25.2 (H) 20.0 - 24.0 mEq/L   TCO2 27 0 - 100 mmol/L   O2 Saturation 85.0 %   Acid-base deficit 3.0 (H) 0.0 - 2.0 mmol/L   Sample type VENOUS    Dg Chest Port 1 View  11/06/2014   CLINICAL DATA:  COPD exacerbation.  Shortness of breath tonight.  EXAM: PORTABLE CHEST - 1 VIEW  COMPARISON:  08/02/2014  FINDINGS: Hyperinflation. Normal heart size and pulmonary vascularity. No focal airspace disease or consolidation in the lungs. No blunting of costophrenic angles. No pneumothorax. Mediastinal contours appear intact. Mild thoracic scoliosis convex towards the right.  IMPRESSION: Hyperinflation consistent with emphysema. No evidence of active pulmonary disease.   Electronically Signed   By: Lucienne Capers M.D.   On:  11/06/2014 00:57    Review of Systems  Constitutional: Positive for fever. Negative for chills, weight loss, malaise/fatigue and diaphoresis.  HENT: Negative.   Eyes: Negative.   Respiratory: Positive for cough, shortness of breath and wheezing. Negative for hemoptysis and sputum production.   Cardiovascular: Negative.   Gastrointestinal: Negative.   Genitourinary: Negative.   Musculoskeletal: Negative.   Skin: Negative.   Neurological: Negative.  Negative for weakness.  Endo/Heme/Allergies: Negative.   Psychiatric/Behavioral: Negative.     Blood pressure 99/70, pulse 139, temperature 98.7 F (37.1 C), temperature source Oral, resp. rate 16, height _0  (1.854 m), weight 63.05 kg (139 lb), SpO2 98 %. Physical Exam  Constitutional: She is oriented to person, place, and time. She appears well-developed and well-nourished.  HENT:  Head: Normocephalic and atraumatic.  Mouth/Throat: No oropharyngeal exudate.  Eyes: Conjunctivae and EOM are normal. Pupils are equal, round, and reactive to light. No scleral icterus.  Neck: Normal range of motion. Neck supple. No JVD present. No tracheal deviation present. No thyromegaly present.  Cardiovascular: Regular rhythm.  Exam reveals no gallop and no friction rub.   No murmur heard. Tachy s1,s2  Respiratory: She is in respiratory distress. She has wheezes. She has no rales. She exhibits no tenderness.  GI: Soft. Bowel sounds are normal. She exhibits no distension. There is no tenderness. There is no rebound and no guarding.  Musculoskeletal: Normal range of motion. She exhibits no edema or tenderness.  Lymphadenopathy:    She has no cervical adenopathy.  Neurological: She is alert and oriented to person, place, and time. She has normal reflexes. She displays normal reflexes. No cranial nerve deficit. She exhibits normal muscle tone. Coordination normal.  Skin: Skin is warm and dry. No rash noted. No erythema. No pallor.  Psychiatric: She has a  normal mood and affect. Her behavior is normal. Judgment and thought content normal.     Assessment/Plan Acute respiratory failure Secondary to Copd/ asthma exacerbation Solumedrol 30m iv q8h spiriva 1puff qday Albuterol neb 1 neb po q6h and q6h prn levaquin 5037miv qday  Tachycardia Check tsh Check trop i q6h x3 Check echo Check d dimer  If positive then please order CTA chest  ARF Check urine sodium, urine creatinine , urine  eosinophils Check renal ultrasound  Hyperglycemia Check hga1c  DVT prophylaxis:scd, lovenox   Renzo Vincelette 11/06/2014, 1:30 AM

## 2014-11-06 NOTE — ED Notes (Signed)
Attempted report x1. 

## 2014-11-06 NOTE — Progress Notes (Signed)
  Echocardiogram 2D Echocardiogram has been performed.  Jessica Townsend 11/06/2014, 9:03 AM

## 2014-11-06 NOTE — ED Notes (Signed)
Hospitalist at bedside 

## 2014-11-06 NOTE — ED Notes (Signed)
Per EMS: pt comes from home c/o respiratory distress.  Pt has hx pulmonary edema, COPD, emphysema - not on O2 at home.  Upon EMS arrival, SpO2 was 89% RA - went up to 98% on 8L O2.  Pt did see his PCP today and was given 2 albuterol treatments.  EMS gave duoneb, 5 albuterol, 0.5 Atrovent, and  solumedrol.  Pt has non-productive cough and chest throbbing.  EMS VS: 170/110, 120s ST.  Pt A&O x 4.

## 2014-11-06 NOTE — Progress Notes (Signed)
Utilization Review Completed.  

## 2014-11-06 NOTE — Progress Notes (Signed)
Admission questions and skin assessment deferred at this time per pt request - anxiety level very high and pt becomes SOB with even 1-2 word answers.

## 2014-11-07 DIAGNOSIS — N179 Acute kidney failure, unspecified: Secondary | ICD-10-CM

## 2014-11-07 DIAGNOSIS — R739 Hyperglycemia, unspecified: Secondary | ICD-10-CM

## 2014-11-07 LAB — BASIC METABOLIC PANEL
Anion gap: 9 (ref 5–15)
BUN: 19 mg/dL (ref 6–20)
CHLORIDE: 104 mmol/L (ref 101–111)
CO2: 24 mmol/L (ref 22–32)
CREATININE: 1.13 mg/dL — AB (ref 0.44–1.00)
Calcium: 8.9 mg/dL (ref 8.9–10.3)
GFR calc non Af Amer: 51 mL/min — ABNORMAL LOW (ref >60)
GFR, EST AFRICAN AMERICAN: 59 mL/min — AB (ref >60)
GLUCOSE: 135 mg/dL — AB (ref 65–99)
Potassium: 4.2 mmol/L (ref 3.5–5.1)
Sodium: 137 mmol/L (ref 135–145)

## 2014-11-07 LAB — CBC
HEMATOCRIT: 37.2 % (ref 36.0–46.0)
HEMOGLOBIN: 13.1 g/dL (ref 12.0–15.0)
MCH: 31.6 pg (ref 26.0–34.0)
MCHC: 35.2 g/dL (ref 30.0–36.0)
MCV: 89.9 fL (ref 78.0–100.0)
Platelets: 221 10*3/uL (ref 150–400)
RBC: 4.14 MIL/uL (ref 3.87–5.11)
RDW: 14.1 % (ref 11.5–15.5)
WBC: 16.5 10*3/uL — ABNORMAL HIGH (ref 4.0–10.5)

## 2014-11-07 LAB — HEMOGLOBIN A1C
Hgb A1c MFr Bld: 5.7 % — ABNORMAL HIGH (ref 4.8–5.6)
Mean Plasma Glucose: 117 mg/dL

## 2014-11-07 LAB — CALCIUM / CREATININE RATIO, URINE
CALCIUM UR: 10.1 mg/dL
CALCIUM/CREAT. RATIO: 81 mg/g{creat} (ref 0–260)
Creatinine, Urine: 125.2 mg/dL

## 2014-11-07 MED ORDER — LEVOFLOXACIN IN D5W 750 MG/150ML IV SOLN
750.0000 mg | INTRAVENOUS | Status: DC
  Administered 2014-11-07: 750 mg via INTRAVENOUS
  Filled 2014-11-07: qty 150

## 2014-11-07 MED ORDER — LORAZEPAM 2 MG/ML IJ SOLN
1.0000 mg | Freq: Once | INTRAMUSCULAR | Status: AC
  Administered 2014-11-07: 1 mg via INTRAVENOUS
  Filled 2014-11-07: qty 1

## 2014-11-07 MED ORDER — ALPRAZOLAM 0.5 MG PO TABS
0.5000 mg | ORAL_TABLET | Freq: Three times a day (TID) | ORAL | Status: DC | PRN
  Administered 2014-11-07 – 2014-11-08 (×2): 0.5 mg via ORAL
  Filled 2014-11-07 (×2): qty 1

## 2014-11-07 MED ORDER — LEVOFLOXACIN IN D5W 500 MG/100ML IV SOLN: 500.0000 mg | INTRAVENOUS | Status: DC

## 2014-11-07 NOTE — Progress Notes (Signed)
[facility] PROGRESS NOTE  Jessica Townsend FAO:[id number] DOB: March 14, 1950 DOA: 11/06/2014  PCP: Carollee Herter, MD  Brief HPI: 64 year old female who is a transsexual and prefers to be addressed is a female with a past medical history of COPD/asthma who continues to smoke tobacco, presented with 3 day history of shortness of breath, cough. Patient was admitted for management of acute bronchitis versus COPD exacerbation.  Past medical history:  Past Medical History  Diagnosis Date  . Asthma   . Smoker   . Mood disorder   . Trans-sexualism   . COPD (chronic obstructive pulmonary disease)     Consultants: None  Procedures:   Echocardiogram Study Conclusions - Procedure narrative: Transthoracic echocardiography. Imagequality was fair. The study was technically difficult, as aresult of poor acoustic windows, restricted patient mobility, andchest wall deformity. - Left ventricle: The cavity size was normal. Systolic function wasnormal. Wall motion was normal; there were no regional wallmotion abnormalities. Doppler parameters are consistent withabnormal left ventricular relaxation (grade 1 diastolicdysfunction). There was no evidence of elevated ventricularfilling pressure by Doppler parameters. - Aortic valve: There was no regurgitation. - Aortic root: The aortic root was normal in size. - Left atrium: The atrium was normal in size. - Right ventricle: The cavity size was normal. Wall thickness wasnormal. Systolic function was normal. - Right atrium: The atrium was normal in size. - Tricuspid valve: There was trivial regurgitation. - Pulmonary arteries: Systolic pressure was within the normalrange. - Inferior vena cava: The vessel was normal in size. - Pericardium, extracardiac: There was no pericardial effusion.  Antibiotics: Intravenous Levaquin 9/7  Subjective: Patient starting to feel better. Not wheezing as much. Pain is improving. He is coughing up  brownish expectoration.   Objective: Vital Signs  Filed Vitals:   11/07/14 0000 11/07/14 0156 11/07/14 0400 11/07/14 0500  BP:   119/59   Pulse:   73   Temp: 98.7 F (37.1 C)  98.5 F (36.9 C)   TempSrc: Oral  Oral   Resp:   30   Height:      Weight:    62.2 kg (137 lb 2 oz)  SpO2:  95% 95%     Intake/Output Summary (Last 24 hours) at 11/07/14 0724 Last data filed at 11/06/14 2116  Gross per 24 hour  Intake   1203 ml  Output    200 ml  Net   1003 ml   Filed Weights   11/06/14 0017 11/06/14 0437 11/07/14 0500  Weight: 63.05 kg (139 lb) 65.6 kg (144 lb 10 oz) 62.2 kg (137 lb 2 oz)    General appearance: alert, cooperative, appears stated age, no distress and Very anxious Resp: Improving air entry bilaterally. Less wheezes compared to yesterday.  Cardio: regular rate and rhythm, S1, S2 normal, no murmur, click, rub or gallop GI: soft, non-tender; bowel sounds normal; no masses,  no organomegaly Neurologic: Alert and oriented and 3. No focal neurological deficits are noted.  Lab Results:  Basic Metabolic Panel:  Recent Labs Lab 11/06/14 0037 11/06/14 0532 11/07/14 0305  NA 137 136 137  K 3.8 3.8 4.2  CL 102 102 104  CO2 22 19* 24  GLUCOSE 148* 159* 135*  BUN CREATININE 1.55* 1.46* 1.13*  CALCIUM 9.0 8.7* 8.9   Liver Function Tests:  Recent Labs Lab 11/06/14 0532  AST 35  ALT 18  ALKPHOS 79  BILITOT 0.3  PROT 6.9  ALBUMIN 3.8   CBC:  Recent Labs Lab 11/06/14  0037 11/06/14 0532 11/07/14 0305  WBC 9.0 8.5 16.5*  NEUTROABS 6.4 7.9*  --   HGB 15.0 13.7 13.1  HCT 43.7 40.4 37.2  MCV 90.7 90.4 89.9  PLT 246 252 221   Cardiac Enzymes:  Recent Labs Lab 11/06/14 0532 11/06/14 1048 11/06/14 1716  TROPONINI <0.03 0.07* <0.03    Recent Results (from the past 240 hour(s))  MRSA PCR Screening     Status: None   Collection Time: 11/06/14  4:06 AM  Result Value Ref Range Status   MRSA by PCR NEGATIVE NEGATIVE Final    Comment:         The GeneXpert MRSA Assay (FDA approved for NASAL specimens only), is one component of a comprehensive MRSA colonization surveillance program. It is not intended to diagnose MRSA infection nor to guide or monitor treatment for MRSA infections.       Studies/Results: US Renal  11/06/2014   CLINICAL DATA:  64 year old female with acute renal failure.  EXAM: RENAL / URINARY TRACT ULTRASOUND COMPLETE  COMPARISON:  None.  FINDINGS: Right Kidney:  Length: 10 cm. Echogenicity within normal limits. No mass or hydronephrosis visualized.  Left Kidney:  Length: 10 cm. Echogenicity within normal limits. No mass or hydronephrosis visualized.  Bladder:  Appears normal for degree of bladder distention.  IMPRESSION: Normal renal ultrasound.   Electronically Signed   By: Elgie Collard M.D.   On: 11/06/2014 02:45   Dg Chest Port 1 View  11/06/2014   CLINICAL DATA:  COPD exacerbation.  Shortness of breath tonight.  EXAM: PORTABLE CHEST - 1 VIEW  COMPARISON:  08/02/2014  FINDINGS: Hyperinflation. Normal heart size and pulmonary vascularity. No focal airspace disease or consolidation in the lungs. No blunting of costophrenic angles. No pneumothorax. Mediastinal contours appear intact. Mild thoracic scoliosis convex towards the right.  IMPRESSION: Hyperinflation consistent with emphysema. No evidence of active pulmonary disease.   Electronically Signed   By: Burman Nieves M.D.   On: 11/06/2014 00:57    Medications:  Scheduled: . albuterol  2.5 mg Nebulization Q6H  . benzonatate  100 mg Oral TID  . enoxaparin (LOVENOX) injection  40 mg Subcutaneous Daily  . [START ON 11/08/2014] levofloxacin (LEVAQUIN) IV  750 mg Intravenous Q48H  . methylPREDNISolone (SOLU-MEDROL) injection  80 mg Intravenous 3 times per day  . sodium chloride  3 mL Intravenous Q12H  . tiotropium  18 mcg Inhalation Daily   Continuous: . albuterol Stopped (11/06/14 0105)   ZOX:[id number] **OR** acetaminophen, albuterol,  chlorpheniramine-HYDROcodone, morphine injection  Assessment/Plan:  Active Problems:   Respiratory distress   COPD exacerbation   Hyperglycemia   ARF (acute renal failure)    Acute respiratory failure with mild hypoxia Secondary to COPD exacerbation versus acute bronchitis. Patient mentions that he did have a fever prior to admission. Starting to feel better. He does look improved. Continue current treatment with nebulizer treatments, steroids and antibiotics. Smoking cessation counseling provided.  Acute COPD exacerbation/acute bronchitis Improving slowly. Continue current management as outlined above. He will benefit from outpatient pulmonary function testing few weeks after he has improved. He should stop smoking.  Chest pain with Sinus tachycardia Likely secondary to the above. TSH is 0.58. Echocardiogram is as above. D-dimer is normal.  Mild acute renal failure Renal function has improved. Continue IV fluids at a lower rate. Renal ultrasound did not show any hydronephrosis. Echogenicity was normal. Monitor urine output.  Hyperglycemia No known history of diabetes. HbA1c is 5.7. Anticipate some worsening in glycemic control  with use of steroids. Continue to monitor for now. Will need outpatient follow-up for same.   DVT Prophylaxis: Lovenox    Code Status: Full code  Family Communication: Discussed with the patient. No family at bedside.  Disposition Plan: Okay for transfer to Med Surg floor.     LOS: 1 day   [facility]  [facility] Pager [phone] 11/07/2014, 7:24 AM  If 7PM-7AM, please contact night-coverage at [email or web address], password [facility]

## 2014-11-08 LAB — BASIC METABOLIC PANEL
Anion gap: 10 (ref 5–15)
BUN: 26 mg/dL — AB (ref 6–20)
CO2: 25 mmol/L (ref 22–32)
CREATININE: 1.29 mg/dL — AB (ref 0.44–1.00)
Calcium: 8.7 mg/dL — ABNORMAL LOW (ref 8.9–10.3)
Chloride: 101 mmol/L (ref 101–111)
GFR calc Af Amer: 50 mL/min — ABNORMAL LOW (ref >60)
GFR, EST NON AFRICAN AMERICAN: 43 mL/min — AB (ref >60)
GLUCOSE: 180 mg/dL — AB (ref 65–99)
Potassium: 4.1 mmol/L (ref 3.5–5.1)
SODIUM: 136 mmol/L (ref 135–145)

## 2014-11-08 LAB — CBC
HCT: 39.4 % (ref 36.0–46.0)
Hemoglobin: 13.6 g/dL (ref 12.0–15.0)
MCH: 31.1 pg (ref 26.0–34.0)
MCHC: 34.5 g/dL (ref 30.0–36.0)
MCV: 90.2 fL (ref 78.0–100.0)
PLATELETS: 249 10*3/uL (ref 150–400)
RBC: 4.37 MIL/uL (ref 3.87–5.11)
RDW: 13.9 % (ref 11.5–15.5)
WBC: 14.9 10*3/uL — ABNORMAL HIGH (ref 4.0–10.5)

## 2014-11-08 MED ORDER — LEVOFLOXACIN 500 MG PO TABS
500.0000 mg | ORAL_TABLET | Freq: Every day | ORAL | Status: DC
  Administered 2014-11-08 – 2014-11-09 (×2): 500 mg via ORAL
  Filled 2014-11-08 (×2): qty 1

## 2014-11-08 NOTE — Progress Notes (Signed)
[facility] PROGRESS NOTE  Jessica Townsend ZOX:[id number] DOB: [birth year] DOA: 11/06/2014  PCP: Carollee Herter, MD  Brief HPI: 64 year old female who is a transsexual and prefers to be addressed is a female with a past medical history of COPD/asthma who continues to smoke tobacco, presented with 3 day history of shortness of breath, cough. Patient was admitted for management of acute bronchitis versus COPD exacerbation.  Past medical history:  Past Medical History  Diagnosis Date  . Asthma   . Smoker   . Mood disorder   . Trans-sexualism   . COPD (chronic obstructive pulmonary disease)     Consultants: None  Procedures:   Echocardiogram Study Conclusions - Procedure narrative: Transthoracic echocardiography. Imagequality was fair. The study was technically difficult, as aresult of poor acoustic windows, restricted patient mobility, andchest wall deformity. - Left ventricle: The cavity size was normal. Systolic function wasnormal. Wall motion was normal; there were no regional wallmotion abnormalities. Doppler parameters are consistent withabnormal left ventricular relaxation (grade 1 diastolicdysfunction). There was no evidence of elevated ventricularfilling pressure by Doppler parameters. - Aortic valve: There was no regurgitation. - Aortic root: The aortic root was normal in size. - Left atrium: The atrium was normal in size. - Right ventricle: The cavity size was normal. Wall thickness wasnormal. Systolic function was normal. - Right atrium: The atrium was normal in size. - Tricuspid valve: There was trivial regurgitation. - Pulmonary arteries: Systolic pressure was within the normalrange. - Inferior vena cava: The vessel was normal in size. - Pericardium, extracardiac: There was no pericardial effusion.  Antibiotics: Levaquin 9/7  Subjective: Patient feeling very anxious. Overall feels much better compared to a few days ago. Cough is better.  Still wheezing.   Objective: Vital Signs  Filed Vitals:   11/08/14 0754 11/08/14 0800 11/08/14 0858 11/08/14 1110  BP: 135/113 135/113  142/93  Pulse: 86 77  82  Temp: 97.8 F (36.6 C)   97.7 F (36.5 C)  TempSrc: Oral   Oral  Resp: 19 22  22   Height:      Weight:      SpO2: 91% 91% 95% 94%    Intake/Output Summary (Last 24 hours) at 11/08/14 1117 Last data filed at 11/08/14 0800  Gross per 24 hour  Intake    360 ml  Output    400 ml  Net    -40 ml   Filed Weights   11/06/14 0437 11/07/14 0500 11/08/14 0500  Weight: 65.6 kg (144 lb 10 oz) 62.2 kg (137 lb 2 oz) 62 kg (136 lb 11 oz)    General appearance: alert, cooperative, appears stated age, no distress and Very anxious Resp: Improving air entry bilaterally. Continues to have wheezing bilaterally. No definite crackles. Cardio: regular rate and rhythm, S1, S2 normal, no murmur, click, rub or gallop GI: soft, non-tender; bowel sounds normal; no masses,  no organomegaly Neurologic: Alert and oriented and 3. No focal neurological deficits are noted.  Lab Results:  Basic Metabolic Panel:  Recent Labs Lab 11/06/14 0037 11/06/14 0532 11/07/14 0305 11/08/14 0920  NA 137 136 137 136  K 3.8 3.8 4.2 4.1  CL 102 102 104 101  CO2 22 19* 24 25  GLUCOSE 148* 159* 135* 180*  BUN 11 11 19  26*  CREATININE 1.55* 1.46* 1.13* 1.29*  CALCIUM 9.0 8.7* 8.9 8.7*   Liver Function Tests:  Recent Labs Lab 11/06/14 0532  AST 35  ALT 18  ALKPHOS 79  BILITOT 0.3  PROT 6.9  ALBUMIN 3.8   CBC:  Recent Labs Lab 11/06/14 0037 11/06/14 0532 11/07/14 0305 11/08/14 0920  WBC 9.0 8.5 16.5* 14.9*  NEUTROABS 6.4 7.9*  --   --   HGB 15.0 13.7 13.1 13.6  HCT 43.7 40.4 37.2 39.4  MCV 90.7 90.4 89.9 90.2  PLT 246 252 221 249   Cardiac Enzymes:  Recent Labs Lab 11/06/14 0532 11/06/14 1048 11/06/14 1716  TROPONINI <0.03 0.07* <0.03    Recent Results (from the past 240 hour(s))  Culture, blood (routine x 2)     Status:  None (Preliminary result)   Collection Time: 11/06/14 12:30 AM  Result Value Ref Range Status   Specimen Description BLOOD LEFT ANTECUBITAL  Final   Special Requests BOTTLES DRAWN AEROBIC AND ANAEROBIC 5CC  Final   Culture NO GROWTH 1 DAY  Final   Report Status PENDING  Incomplete  Culture, blood (routine x 2)     Status: None (Preliminary result)   Collection Time: 11/06/14 12:36 AM  Result Value Ref Range Status   Specimen Description BLOOD RIGHT HAND  Final   Special Requests BOTTLES DRAWN AEROBIC AND ANAEROBIC 5CC  Final   Culture NO GROWTH 1 DAY  Final   Report Status PENDING  Incomplete  MRSA PCR Screening     Status: None   Collection Time: 11/06/14  4:06 AM  Result Value Ref Range Status   MRSA by PCR NEGATIVE NEGATIVE Final    Comment:        The GeneXpert MRSA Assay (FDA approved for NASAL specimens only), is one component of a comprehensive MRSA colonization surveillance program. It is not intended to diagnose MRSA infection nor to guide or monitor treatment for MRSA infections.       Studies/Results: No results found.  Medications:  Scheduled: . albuterol  2.5 mg Nebulization Q6H  . benzonatate  100 mg Oral TID  . enoxaparin (LOVENOX) injection  40 mg Subcutaneous Daily  . levofloxacin  500 mg Oral Daily  . methylPREDNISolone (SOLU-MEDROL) injection  80 mg Intravenous 3 times per day  . sodium chloride  3 mL Intravenous Q12H  . tiotropium  18 mcg Inhalation Daily   Continuous: . albuterol Stopped (11/06/14 0105)   ONG:[id number] **OR** acetaminophen, albuterol, ALPRAZolam, chlorpheniramine-HYDROcodone, morphine injection  Assessment/Plan:  Active Problems:   Respiratory distress   COPD exacerbation   Hyperglycemia   ARF (acute renal failure)    Acute respiratory failure with mild hypoxia Secondary to COPD exacerbation versus acute bronchitis. Patient mentions that he did have a fever prior to admission. Very slow to improve. Continue current  treatment with nebulizer treatments, steroids and antibiotics. Smoking cessation counseling provided.  Acute COPD exacerbation/acute bronchitis Improving slowly. Continue current management as outlined above. He will benefit from outpatient pulmonary function testing few weeks after he has improved. He should stop smoking. This was reemphasized.  Chest pain with Sinus tachycardia Likely secondary to the above. TSH is 0.58. Echocardiogram is as above. D-dimer is normal. No further workup anticipated at this time.  Mild acute renal failure Renal function has improved. Continue IV fluids at a lower rate. Renal ultrasound did not show any hydronephrosis. Echogenicity was normal. Monitor urine output.  Hyperglycemia No known history of diabetes. HbA1c is 5.7. Anticipate some worsening in glycemic control with use of steroids. Continue to monitor for now. Will need outpatient follow-up for same.   DVT Prophylaxis: Lovenox    Code Status: Full code  Family Communication: Discussed with the patient. No family  at bedside.  Disposition Plan: Okay for transfer to Med Surg floor. Start mobilizing.     LOS: 2 days   [facility]  [facility] Pager [phone] 11/08/2014, 11:17 AM  If 7PM-7AM, please contact night-coverage at [email or web address], password [facility]

## 2014-11-08 NOTE — Progress Notes (Signed)
Pt  Arrived to the unit via wheelchair.  Ambulated from wheelchair to bed, gait steady.  Pt alert and oriented x4.  P welcomed and settled into the room.  VSS.  Will continue to monitor. Sondra Come, RN

## 2014-11-08 NOTE — Care Management Important Message (Signed)
Important Message  Patient Details  Name: Jessica Townsend MRN: [id number] Date of Birth: 12-31-1950   Medicare Important Message Given:  [facility], [facility], [facility] notification given    Kyla Balzarine 11/08/2014, 11:29 AMImportant Message  Patient Details  Name: Jessica Townsend MRN: [id number] Date of Birth: 27-May-1950   Medicare Important Message Given:  Yes-second notification given    Kyla Balzarine 11/08/2014, 11:29 AM

## 2014-11-09 MED ORDER — HYDROCOD POLST-CPM POLST ER 10-8 MG/5ML PO SUER: 5.0000 mL | Freq: Two times a day (BID) | ORAL | Status: DC | PRN

## 2014-11-09 MED ORDER — PREDNISONE 20 MG PO TABS
ORAL_TABLET | ORAL | Status: DC
Start: 2014-11-09 — End: 2014-12-17

## 2014-11-09 MED ORDER — TIOTROPIUM BROMIDE MONOHYDRATE 18 MCG IN CAPS: 18.0000 ug | ORAL_CAPSULE | Freq: Every day | RESPIRATORY_TRACT | Status: DC

## 2014-11-09 MED ORDER — BENZONATATE 100 MG PO CAPS: 100.0000 mg | ORAL_CAPSULE | Freq: Three times a day (TID) | ORAL | Status: DC | PRN

## 2014-11-09 MED ORDER — PREDNISONE 20 MG PO TABS
60.0000 mg | ORAL_TABLET | Freq: Every day | ORAL | Status: DC
  Administered 2014-11-09: 60 mg via ORAL
  Filled 2014-11-09: qty 3

## 2014-11-09 MED ORDER — LEVOFLOXACIN 500 MG PO TABS: 500.0000 mg | ORAL_TABLET | Freq: Every day | ORAL | Status: DC

## 2014-11-09 NOTE — Discharge Instructions (Signed)

## 2014-11-09 NOTE — Discharge Summary (Signed)
[facility]  Physician Discharge Summary   Patient ID: Jessica Townsend MRN: [id number] DOB/AGE: 08/19/1950 64 y.o.  Admit date: 11/06/2014 Discharge date: 11/09/2014  PCP: Carollee Herter, MD  DISCHARGE DIAGNOSES:  Active Problems:   Respiratory distress   COPD exacerbation   Hyperglycemia   ARF (acute renal failure)   RECOMMENDATIONS FOR OUTPATIENT FOLLOW UP: 1. Patient is to follow-up with his PCP before the end of week 2. See discussions regarding his blood sugar 3. He was benefit from pulmonary function testing if he has not had one yet   DISCHARGE CONDITION: fair  Diet recommendation: Regular  Filed Weights   11/07/14 0500 11/08/14 0500 11/09/14 0500  Weight: 62.2 kg (137 lb 2 oz) 62 kg (136 lb 11 oz) 62.8 kg (138 lb 7.2 oz)    INITIAL HISTORY: 64 year old female who is a transsexual and prefers to be addressed as a female with a past medical history of COPD/asthma who continues to smoke tobacco, presented with 3 day history of shortness of breath, cough. Patient was admitted for management of acute bronchitis versus COPD exacerbation.  Consultations:  None  Procedures: Echocardiogram Study Conclusions - Procedure narrative: Transthoracic echocardiography. Imagequality was fair. The study was technically difficult, as aresult of poor acoustic windows, restricted patient mobility, andchest wall deformity. - Left ventricle: The cavity size was normal. Systolic function wasnormal. Wall motion was normal; there were no regional wallmotion abnormalities. Doppler parameters are consistent withabnormal left ventricular relaxation (grade 1 diastolicdysfunction). There was no evidence of elevated ventricularfilling pressure by Doppler parameters. - Aortic valve: There was no regurgitation. - Aortic root: The aortic root was normal in size. - Left atrium: The atrium was normal in size. - Right ventricle: The cavity size was normal. Wall thickness  wasnormal. Systolic function was normal. - Right atrium: The atrium was normal in size. - Tricuspid valve: There was trivial regurgitation. - Pulmonary arteries: Systolic pressure was within the normalrange. - Inferior vena cava: The vessel was normal in size. - Pericardium, extracardiac: There was no pericardial effusion.  HOSPITAL COURSE:   Acute respiratory failure with mild hypoxia This was secondary to COPD exacerbation versus acute bronchitis. Patient mentioned that he did have a fever prior to admission. Patient was started on nebulizer treatments, steroids and antibiotics. He was slow to improve. Smoking cessation counseling provided.  Acute COPD exacerbation/acute bronchitis Patient was slow to improve. Antibiotics were changed to oral. Feels much better this morning. Still has some wheezing but he states that this is chronic for him. He was asked to follow-up with his primary care provider. He'll be discharged on tapering doses of steroids and oral antibiotics. He has nebulizer treatments at home.  Chest pain with Sinus tachycardia Chest pain was most likely secondary to his respiratory symptoms. Echocardiogram did not show any wall motion abnormalities. D-dimer was normal. TSH is 0.58. Pain is improved.  Mild acute renal failure Renal function has improved. Renal ultrasound did not show any hydronephrosis. Echogenicity was normal. Continue to monitor renal function as outpatient  Hyperglycemia No known history of diabetes. HbA1c is 5.7. He was asked to discuss this further with his PCP. Blood sugar did go up with steroids. But this should resolve as he is tapered off his steroids.  Patient very keen on going home today. He has been ambulating in the hallway. He saturating normal on room air. Feels much better. Okay for discharge.    PERTINENT LABS:  The results of significant diagnostics from this hospitalization (including imaging, microbiology,  ancillary and laboratory)  are listed below for reference.    Microbiology: Recent Results (from the past 240 hour(s))  Culture, blood (routine x 2)     Status: None (Preliminary result)   Collection Time: 11/06/14 12:30 AM  Result Value Ref Range Status   Specimen Description BLOOD LEFT ANTECUBITAL  Final   Special Requests BOTTLES DRAWN AEROBIC AND ANAEROBIC 5CC  Final   Culture NO GROWTH 3 DAYS  Final   Report Status PENDING  Incomplete  Culture, blood (routine x 2)     Status: None (Preliminary result)   Collection Time: 11/06/14 12:36 AM  Result Value Ref Range Status   Specimen Description BLOOD RIGHT HAND  Final   Special Requests BOTTLES DRAWN AEROBIC AND ANAEROBIC 5CC  Final   Culture NO GROWTH 3 DAYS  Final   Report Status PENDING  Incomplete  MRSA PCR Screening     Status: None   Collection Time: 11/06/14  4:06 AM  Result Value Ref Range Status   MRSA by PCR NEGATIVE NEGATIVE Final    Comment:        The GeneXpert MRSA Assay (FDA approved for NASAL specimens only), is one component of a comprehensive MRSA colonization surveillance program. It is not intended to diagnose MRSA infection nor to guide or monitor treatment for MRSA infections.      Labs: Basic Metabolic Panel:  Recent Labs Lab 11/06/14 0037 11/06/14 0532 11/07/14 0305 11/08/14 0920  NA 137 136 137 136  K 3.8 3.8 4.2 4.1  CL 102 102 104 101  CO2 22 19* 24 25  GLUCOSE 148* 159* 135* 180*  BUN 11 11 19  26*  CREATININE 1.55* 1.46* 1.13* 1.29*  CALCIUM 9.0 8.7* 8.9 8.7*   Liver Function Tests:  Recent Labs Lab 11/06/14 0532  AST 35  ALT 18  ALKPHOS 79  BILITOT 0.3  PROT 6.9  ALBUMIN 3.8   CBC:  Recent Labs Lab 11/06/14 0037 11/06/14 0532 11/07/14 0305 11/08/14 0920  WBC 9.0 8.5 16.5* 14.9*  NEUTROABS 6.4 7.9*  --   --   HGB 15.0 13.7 13.1 13.6  HCT 43.7 40.4 37.2 39.4  MCV 90.7 90.4 89.9 90.2  PLT 246 252 221 249   Cardiac Enzymes:  Recent Labs Lab 11/06/14 0532 11/06/14 1048 11/06/14 1716   TROPONINI <0.03 0.07* <0.03    IMAGING STUDIES US Renal  11/06/2014   CLINICAL DATA:  64 year old female with acute renal failure.  EXAM: RENAL / URINARY TRACT ULTRASOUND COMPLETE  COMPARISON:  None.  FINDINGS: Right Kidney:  Length: 10 cm. Echogenicity within normal limits. No mass or hydronephrosis visualized.  Left Kidney:  Length: 10 cm. Echogenicity within normal limits. No mass or hydronephrosis visualized.  Bladder:  Appears normal for degree of bladder distention.  IMPRESSION: Normal renal ultrasound.   Electronically Signed   By: Elgie Collard M.D.   On: 11/06/2014 02:45   Dg Chest Port 1 View  11/06/2014   CLINICAL DATA:  COPD exacerbation.  Shortness of breath tonight.  EXAM: PORTABLE CHEST - 1 VIEW  COMPARISON:  08/02/2014  FINDINGS: Hyperinflation. Normal heart size and pulmonary vascularity. No focal airspace disease or consolidation in the lungs. No blunting of costophrenic angles. No pneumothorax. Mediastinal contours appear intact. Mild thoracic scoliosis convex towards the right.  IMPRESSION: Hyperinflation consistent with emphysema. No evidence of active pulmonary disease.   Electronically Signed   By: Burman Nieves M.D.   On: 11/06/2014 00:57    DISCHARGE EXAMINATION: Filed Vitals:  11/09/14 0253 11/09/14 0500 11/09/14 0539 11/09/14 1025  BP: 121/69  116/71 119/65  Pulse: 70  66 76  Temp: 98 F (36.7 C)  98.8 F (37.1 C) 98.1 F (36.7 C)  TempSrc: Oral  Oral Oral  Resp: Height:      Weight:  62.8 kg (138 lb 7.2 oz)    SpO2: 93%  90% 91%   General appearance: alert, cooperative, appears stated age and no distress Resp: Few end expiratory wheezing bilaterally. No rhonchi. Cardio: regular rate and rhythm, S1, S2 normal, no murmur, click, rub or gallop GI: soft, non-tender; bowel sounds normal; no masses,  no organomegaly Extremities: extremities normal, atraumatic, no cyanosis or edema  DISPOSITION: Home  Discharge Instructions    Call MD for:   difficulty breathing, headache or visual disturbances    Complete by:  As directed      Call MD for:  extreme fatigue    Complete by:  As directed      Call MD for:  persistant dizziness or light-headedness    Complete by:  As directed      Call MD for:  temperature >100.4    Complete by:  As directed      Diet general    Complete by:  As directed      Discharge instructions    Complete by:  As directed   You will need to follow up with your PCP before end of week. Discuss with your PCP regarding a Pulmonary Function Test. Take your medications as prescribed. Please stop smoking.  You were cared for by a hospitalist during your hospital stay. If you have any questions about your discharge medications or the care you received while you were in the hospital after you are discharged, you can call the unit and asked to speak with the hospitalist on call if the hospitalist that took care of you is not available. Once you are discharged, your primary care physician will handle any further medical issues. Please note that NO REFILLS for any discharge medications will be authorized once you are discharged, as it is imperative that you return to your primary care physician (or establish a relationship with a primary care physician if you do not have one) for your aftercare needs so that they can reassess your need for medications and monitor your lab values. If you do not have a primary care physician, you can call [phone] for a physician referral.     Increase activity slowly    Complete by:  As directed            ALLERGIES:  Allergies  Allergen Reactions  . Antihistamines, Diphenhydramine-Type Anaphylaxis and Shortness Of Breath    All types   . Monosodium Glutamate Anaphylaxis  . Other Shortness Of Breath    CANED FRUITS, MOST SPICES, CHICKEN AND BEEF GREASE AND FAT, VEGETABLE OIL, PORK FAT   . Sulfate Anaphylaxis  . Peanuts [Peanut Oil] Other (See Comments)    "I DIE"     Discharge  Medication List as of 11/09/2014 10:36 AM    START taking these medications   Details  benzonatate (TESSALON) 100 MG capsule Take 1 capsule (100 mg total) by mouth 3 (three) times daily as needed for cough., Starting 11/09/2014, Until Discontinued, Print    chlorpheniramine-HYDROcodone (TUSSIONEX) 10-8 MG/5ML SUER Take 5 mLs by mouth every 12 (twelve) hours as needed for cough., Starting 11/09/2014, Until Discontinued, Print    levofloxacin (LEVAQUIN) 500  MG tablet Take 1 tablet (500 mg total) by mouth daily. For 4 more days, Starting 11/09/2014, Until Discontinued, Print    predniSONE (DELTASONE) 20 MG tablet Starting tomorrow Take 3 tablets once daily for 4 days, then take 2 tablets once daily for 4 days, then take 1 tablet once daily for 4 days, then STOP, Print    tiotropium (SPIRIVA) 18 MCG inhalation capsule Place 1 capsule (18 mcg total) into inhaler and inhale daily., Starting 11/09/2014, Until Discontinued, Print      CONTINUE these medications which have NOT CHANGED   Details  albuterol (PROVENTIL) (2.5 MG/3ML) 0.083% nebulizer solution Take 3 mLs (2.5 mg total) by nebulization every 6 (six) hours as needed for wheezing or shortness of breath., Starting 08/30/2014, Until Discontinued, Normal    budesonide-formoterol (SYMBICORT) 160-4.5 MCG/ACT inhaler INHALE 2 PUFFS INTO THE LUNGS 2 (TWO) TIMES DAILY., Normal      STOP taking these medications     amoxicillin-clavulanate (AUGMENTIN) 875-125 MG per tablet        Follow-up Information    Follow up with Carollee Herter, MD. Schedule an appointment as soon as possible for a visit in 3 days.   Specialty:  Family Medicine   Why:  post hospitalization follow up   Contact information:   [address] [place] [address] [phone]       TOTAL DISCHARGE TIME: 35 minutes  [facility]  [facility] Pager [phone]  11/09/2014, 2:06 PM

## 2014-11-09 NOTE — Progress Notes (Signed)
Patient ready for discharge to home; discharge instructions given and reviewed; Rx's given; patient discharged out via wheelchair; all belongings returned; patient received a HHN prior to discharge. No acute distress;ready for release.

## 2014-11-11 ENCOUNTER — Telehealth: Payer: Self-pay | Admitting: Family Medicine

## 2014-11-11 LAB — CULTURE, BLOOD (ROUTINE X 2)
Culture: NO GROWTH
Culture: NO GROWTH

## 2014-11-11 NOTE — Telephone Encounter (Signed)
Jessica Townsend called in to make a hospital follow up appt. While on the phone transition of care was completed. Jessica Townsend states that she is doing much better although over all she is hurting. Jessica Townsend states she is hurting in her chest and on her arms where IV's where inserted. Jessica Townsend states that her breathing issues are much better. She stated that some of her medications had been changed but she understood how to take them and had all refilled and available. Jessica Townsend made a follow up appt for this week and was informed to bring all her medications with her. Jessica Townsend verbalized understanding. Jessica Townsend was informed to call us if any issues came up.

## 2014-11-14 ENCOUNTER — Encounter: Payer: Self-pay | Admitting: Family Medicine

## 2014-11-14 ENCOUNTER — Ambulatory Visit (INDEPENDENT_AMBULATORY_CARE_PROVIDER_SITE_OTHER): Payer: Medicare Other | Admitting: Family Medicine

## 2014-11-14 VITALS — BP 128/80 | HR 64 | Wt 143.2 lb

## 2014-11-14 DIAGNOSIS — J449 Chronic obstructive pulmonary disease, unspecified: Secondary | ICD-10-CM | POA: Diagnosis not present

## 2014-11-14 DIAGNOSIS — Z87891 Personal history of nicotine dependence: Secondary | ICD-10-CM

## 2014-11-14 DIAGNOSIS — R739 Hyperglycemia, unspecified: Secondary | ICD-10-CM | POA: Diagnosis not present

## 2014-11-14 DIAGNOSIS — J441 Chronic obstructive pulmonary disease with (acute) exacerbation: Secondary | ICD-10-CM | POA: Insufficient documentation

## 2014-11-14 DIAGNOSIS — N179 Acute kidney failure, unspecified: Secondary | ICD-10-CM

## 2014-11-14 DIAGNOSIS — R7309 Other abnormal glucose: Secondary | ICD-10-CM | POA: Diagnosis not present

## 2014-11-14 LAB — COMPREHENSIVE METABOLIC PANEL
ALBUMIN: 3.9 g/dL (ref 3.6–5.1)
ALK PHOS: 73 U/L (ref 33–130)
ALT: 18 U/L (ref 6–29)
AST: 16 U/L (ref 10–35)
BILIRUBIN TOTAL: 0.5 mg/dL (ref 0.2–1.2)
BUN: 15 mg/dL (ref 7–25)
CALCIUM: 9.2 mg/dL (ref 8.6–10.4)
CO2: 27 mmol/L (ref 20–31)
Chloride: 101 mmol/L (ref 98–110)
Creat: 1.01 mg/dL — ABNORMAL HIGH (ref 0.50–0.99)
GLUCOSE: 89 mg/dL (ref 65–99)
Potassium: 4.5 mmol/L (ref 3.5–5.3)
Sodium: 139 mmol/L (ref 135–146)
TOTAL PROTEIN: 6.4 g/dL (ref 6.1–8.1)

## 2014-11-14 NOTE — Patient Instructions (Signed)
Call [facility] concerning the smoking.Stay on your two inhalers.Finish the Augmentin

## 2014-11-14 NOTE — Progress Notes (Signed)
   Subjective:    Patient ID: Jessica Townsend, female    DOB: [birth year], 64 y.o.   MRN: [id number]  HPI Jessica Townsend is here for follow-up visit after recent hospitalization. She was in the hospital for 3 days for treatment of COPD exacerbation. The emergency room record and hospital discharge summary was reviewed. While in the hospital she was evaluated with an echocardiogram as well as renal ultrasound. She also had elevated blood sugar as well as elevated renal functions. Presently she is taking Tussionex, on a prednisone taper, Spiriva and Augmentin. She was given Levaquin but did not take it. She states that she is doing much better.She states that she has quit smoking.   Review of Systems     Objective:   Physical Exam Alert and in no distress. Lungs are clear to auscultation. Cardiac exam shows regular rhythm without murmurs or gallops. Exam of her arms does show multiple ecchymotic areas from previous IVs. Her medications were reviewed.PFT done and it did show mild restriction.      Assessment & Plan:  Chronic obstructive pulmonary disease, unspecified COPD, unspecified chronic bronchitis type - Plan: Spirometry with graph, CBC with Differential/Platelet, Comprehensive metabolic panel  Acute renal failure, unspecified acute renal failure type - Plan: CBC with Differential/Platelet, Comprehensive metabolic panel  Hyperglycemia - Plan: CBC with Differential/Platelet, Comprehensive metabolic panel I reviewed medications with her in detail. She will take the Sterapred dose pack until it is gone. Continue on Spiriva as well as Symbicort and finish out the anti-biotic.I explained that I expected the renal functions to improve as well as her blood sugar which was elevated in the hospital however she was on steroid. I explained that I did not anticipate diabetes become an issue. I do not think referral to pulmonary is appropriate or necessary at this time.Approximately 35 minutes, greater  than 50% spent in counseling and coordination of care.I will probably repeat PFTs in several months to see where she has had in the stable condition.

## 2014-11-15 LAB — CBC WITH DIFFERENTIAL/PLATELET
BASOS ABS: 0 10*3/uL (ref 0.0–0.1)
Basophils Relative: 0 % (ref 0–1)
Eosinophils Absolute: 0.1 10*3/uL (ref 0.0–0.7)
Eosinophils Relative: 1 % (ref 0–5)
HEMATOCRIT: 44.9 % (ref 36.0–46.0)
HEMOGLOBIN: 15.1 g/dL — AB (ref 12.0–15.0)
LYMPHS ABS: 2.1 10*3/uL (ref 0.7–4.0)
LYMPHS PCT: 16 % (ref 12–46)
MCH: 30.7 pg (ref 26.0–34.0)
MCHC: 33.6 g/dL (ref 30.0–36.0)
MCV: 91.3 fL (ref 78.0–100.0)
MPV: 9.3 fL (ref 8.6–12.4)
Monocytes Absolute: 0.5 10*3/uL (ref 0.1–1.0)
Monocytes Relative: 4 % (ref 3–12)
NEUTROS ABS: 10.4 10*3/uL — AB (ref 1.7–7.7)
NEUTROS PCT: 79 % — AB (ref 43–77)
Platelets: 424 10*3/uL — ABNORMAL HIGH (ref 150–400)
RBC: 4.92 MIL/uL (ref 3.87–5.11)
RDW: 14.4 % (ref 11.5–15.5)
WBC: 13.2 10*3/uL — AB (ref 4.0–10.5)

## 2014-11-21 ENCOUNTER — Other Ambulatory Visit: Payer: Self-pay | Admitting: Family Medicine

## 2014-12-17 ENCOUNTER — Encounter: Payer: Self-pay | Admitting: Family Medicine

## 2014-12-17 ENCOUNTER — Ambulatory Visit (INDEPENDENT_AMBULATORY_CARE_PROVIDER_SITE_OTHER): Payer: Medicare Other | Admitting: Family Medicine

## 2014-12-17 VITALS — BP 122/80 | HR 91 | Ht 73.0 in | Wt 150.0 lb

## 2014-12-17 DIAGNOSIS — Z87891 Personal history of nicotine dependence: Secondary | ICD-10-CM | POA: Diagnosis not present

## 2014-12-17 DIAGNOSIS — D72829 Elevated white blood cell count, unspecified: Secondary | ICD-10-CM | POA: Diagnosis not present

## 2014-12-17 DIAGNOSIS — J449 Chronic obstructive pulmonary disease, unspecified: Secondary | ICD-10-CM

## 2014-12-17 LAB — CBC WITH DIFFERENTIAL/PLATELET
BASOS PCT: 1 % (ref 0–1)
Basophils Absolute: 0.1 10*3/uL (ref 0.0–0.1)
EOS ABS: 0.3 10*3/uL (ref 0.0–0.7)
EOS PCT: 3 % (ref 0–5)
HCT: 40.2 % (ref 36.0–46.0)
Hemoglobin: 14.1 g/dL (ref 12.0–15.0)
Lymphocytes Relative: 35 % (ref 12–46)
Lymphs Abs: 3.3 10*3/uL (ref 0.7–4.0)
MCH: 31.3 pg (ref 26.0–34.0)
MCHC: 35.1 g/dL (ref 30.0–36.0)
MCV: 89.3 fL (ref 78.0–100.0)
MONO ABS: 0.7 10*3/uL (ref 0.1–1.0)
MONOS PCT: 7 % (ref 3–12)
MPV: 9.4 fL (ref 8.6–12.4)
Neutro Abs: 5 10*3/uL (ref 1.7–7.7)
Neutrophils Relative %: 54 % (ref 43–77)
PLATELETS: 360 10*3/uL (ref 150–400)
RBC: 4.5 MIL/uL (ref 3.87–5.11)
RDW: 14.6 % (ref 11.5–15.5)
WBC: 9.3 10*3/uL (ref 4.0–10.5)

## 2014-12-17 LAB — COMPREHENSIVE METABOLIC PANEL
ALT: 9 U/L (ref 6–29)
AST: 12 U/L (ref 10–35)
Albumin: 4 g/dL (ref 3.6–5.1)
Alkaline Phosphatase: 85 U/L (ref 33–130)
BUN: 10 mg/dL (ref 7–25)
CHLORIDE: 107 mmol/L (ref 98–110)
CO2: 25 mmol/L (ref 20–31)
CREATININE: 1.09 mg/dL — AB (ref 0.50–0.99)
Calcium: 9 mg/dL (ref 8.6–10.4)
Glucose, Bld: 93 mg/dL (ref 65–99)
POTASSIUM: 4.4 mmol/L (ref 3.5–5.3)
SODIUM: 140 mmol/L (ref 135–146)
Total Bilirubin: 0.6 mg/dL (ref 0.2–1.2)
Total Protein: 6.5 g/dL (ref 6.1–8.1)

## 2014-12-17 NOTE — Progress Notes (Signed)
   Subjective:    Patient ID: Jessica Townsend, female    DOB: 12-12-1950, 64 y.o.   MRN: [id number]  HPI She is here for a recheck. She continues on the inhalers and is doing well. Continues to remain smoke free. No fever, chills, shortness of breath or wheezing. Pulse ox at home runs between 93 and 96. Review his record does show some minor blood abnormalities that we need to follow-up on. Specifically her white blood count was slightly elevated as well as blood sugar readings when she was on steroids.  Review of Systems     Objective:   Physical Exam Alert and in no distress. Lungs are clear to auscultation. Cardiac exam shows regular rhythm without murmurs or gallops.       Assessment & Plan:  Chronic obstructive pulmonary disease, unspecified COPD, unspecified chronic bronchitis type - Plan: CBC with Differential/Platelet, Comprehensive metabolic panel  Former smoker

## 2014-12-31 ENCOUNTER — Other Ambulatory Visit: Payer: Self-pay | Admitting: Family Medicine

## 2014-12-31 NOTE — Telephone Encounter (Signed)
Is this okay to refill? 

## 2014-12-31 NOTE — Telephone Encounter (Signed)
Pt was here on 10/18 for follow-up on this

## 2015-01-16 DIAGNOSIS — J4541 Moderate persistent asthma with (acute) exacerbation: Secondary | ICD-10-CM | POA: Diagnosis not present

## 2015-01-16 MED ORDER — ALBUTEROL SULFATE (2.5 MG/3ML) 0.083% IN NEBU
2.5000 mg | INHALATION_SOLUTION | Freq: Once | RESPIRATORY_TRACT | Status: AC
  Administered 2015-01-16: 2.5 mg via RESPIRATORY_TRACT

## 2015-01-16 NOTE — Addendum Note (Signed)
Addended by: Lavell IslamHOTON, Vincient Vanaman M on: 01/16/2015 03:26 PM   Modules accepted: Orders

## 2015-01-30 ENCOUNTER — Telehealth: Payer: Self-pay

## 2015-02-03 NOTE — Telephone Encounter (Signed)
error 

## 2015-02-27 ENCOUNTER — Encounter: Payer: Self-pay | Admitting: Family Medicine

## 2015-02-27 ENCOUNTER — Other Ambulatory Visit: Payer: Self-pay | Admitting: Family Medicine

## 2015-02-27 ENCOUNTER — Ambulatory Visit (INDEPENDENT_AMBULATORY_CARE_PROVIDER_SITE_OTHER): Payer: Medicare Other | Admitting: Family Medicine

## 2015-02-27 VITALS — BP 110/70 | HR 85 | Temp 98.0°F | Wt 146.0 lb

## 2015-02-27 DIAGNOSIS — J449 Chronic obstructive pulmonary disease, unspecified: Secondary | ICD-10-CM

## 2015-02-27 DIAGNOSIS — Z87891 Personal history of nicotine dependence: Secondary | ICD-10-CM | POA: Diagnosis not present

## 2015-02-27 DIAGNOSIS — J4530 Mild persistent asthma, uncomplicated: Secondary | ICD-10-CM | POA: Diagnosis not present

## 2015-02-27 MED ORDER — BUDESONIDE-FORMOTEROL FUMARATE 160-4.5 MCG/ACT IN AERO: 2.0000 | INHALATION_SPRAY | Freq: Two times a day (BID) | RESPIRATORY_TRACT | Status: DC

## 2015-02-27 MED ORDER — AMOXICILLIN-POT CLAVULANATE 875-125 MG PO TABS: 1.0000 | ORAL_TABLET | Freq: Two times a day (BID) | ORAL | Status: DC

## 2015-02-27 MED ORDER — ALBUTEROL SULFATE (2.5 MG/3ML) 0.083% IN NEBU
2.5000 mg | INHALATION_SOLUTION | Freq: Once | RESPIRATORY_TRACT | Status: AC
  Administered 2015-02-27: 2.5 mg via RESPIRATORY_TRACT

## 2015-02-27 NOTE — Progress Notes (Signed)
   Subjective:    Patient ID: Jessica MatasMalinda L Townsend, female    DOB: Sep 11, 1950, 64 y.o.   MRN: [id number]  HPI She complains of a 4 day history this started with a productive cough, palpable left-sided chest pain, chills but no fever, sore throat, earache. She states that she still does not smoke. She does do nebulizer treatments at home. Apparently at one time her pulse ox did go down to 91. Recent hospitalization was reviewed and apparently she did stop her Symbicort based on their recommendation. Review of Systems     Objective:   Physical Exam Alert and in no distress. Tympanic membranes and canals are normal. Pharyngeal area is normal. Neck is supple without adenopathy or thyromegaly. Cardiac exam shows a regular sinus rhythm without murmurs or gallops. Lungs show expiratory wheezing that did improve with 2 nebulizer treatments.        Assessment & Plan:  Asthma, mild persistent, uncomplicated - Plan: albuterol (PROVENTIL) (2.5 MG/3ML) 0.083% nebulizer solution 2.5 mg, budesonide-formoterol (SYMBICORT) 160-4.5 MCG/ACT inhaler  Chronic obstructive pulmonary disease, unspecified COPD type (HCC) - Plan: amoxicillin-clavulanate (AUGMENTIN) 875-125 MG tablet  Former smoker  I will start back on Symbicort as I do not see a good reason as to why they stopped it. Instructed on proper use of the albuterol inhaler. She will call if continued difficulty.

## 2015-02-27 NOTE — Patient Instructions (Signed)
Use the nebulizer based on your symptoms; only use the pulse ox  after you  use the nebulizer a few times to decide whether you need to go to the ER

## 2015-02-28 MED ORDER — ALBUTEROL SULFATE (2.5 MG/3ML) 0.083% IN NEBU: 2.5000 mg | INHALATION_SOLUTION | Freq: Once | RESPIRATORY_TRACT | Status: DC

## 2015-02-28 NOTE — Addendum Note (Signed)
Addended by: Herminio CommonsJOHNSON, Keyetta Hollingworth A on: 02/28/2015 10:39 AM   Modules accepted: Orders

## 2015-03-03 ENCOUNTER — Other Ambulatory Visit: Payer: Self-pay | Admitting: Family Medicine

## 2015-03-03 MED ORDER — ALBUTEROL SULFATE (2.5 MG/3ML) 0.083% IN NEBU: INHALATION_SOLUTION | RESPIRATORY_TRACT | Status: DC

## 2015-03-05 ENCOUNTER — Telehealth: Payer: Self-pay | Admitting: Family Medicine

## 2015-03-05 NOTE — Telephone Encounter (Signed)
Pt told Olegario MessierKathy having problems getting one of her meds.  Called pharmacy and states no P.A. on anything, states she has been paying out of pocket for Albuterol neb b/c ins will not pay b/c is considered medical supply, cost $18.79.  And Symbicort co pay is $45 which is what she has normally been paying.  Called pt and she states everything is fine, and wanted you to know that the Symbicort is working and she is much better now

## 2015-05-02 ENCOUNTER — Telehealth: Payer: Self-pay | Admitting: Family Medicine

## 2015-05-02 MED ORDER — ALBUTEROL SULFATE (2.5 MG/3ML) 0.083% IN NEBU: INHALATION_SOLUTION | RESPIRATORY_TRACT | Status: DC

## 2015-05-02 NOTE — Telephone Encounter (Signed)
Pt came in and requested refill on albuterol. Please send to [facility].

## 2015-11-07 ENCOUNTER — Telehealth: Payer: Self-pay | Admitting: Family Medicine

## 2015-11-07 MED ORDER — ALBUTEROL SULFATE (2.5 MG/3ML) 0.083% IN NEBU: INHALATION_SOLUTION | RESPIRATORY_TRACT | 5 refills | Status: DC

## 2015-11-07 NOTE — Telephone Encounter (Signed)
Pt called and states that she needs a refill on her albuterol, pt is coming in Monday,  pt uses [id number] - [place], [place] - [address] pt can be reached at [phone]

## 2015-11-10 ENCOUNTER — Ambulatory Visit (INDEPENDENT_AMBULATORY_CARE_PROVIDER_SITE_OTHER): Payer: Medicare Other | Admitting: Family Medicine

## 2015-11-10 ENCOUNTER — Encounter: Payer: Self-pay | Admitting: Family Medicine

## 2015-11-10 VITALS — BP 90/60 | HR 96 | Temp 98.0°F | Wt 136.0 lb

## 2015-11-10 DIAGNOSIS — J4530 Mild persistent asthma, uncomplicated: Secondary | ICD-10-CM | POA: Diagnosis not present

## 2015-11-10 DIAGNOSIS — J449 Chronic obstructive pulmonary disease, unspecified: Secondary | ICD-10-CM

## 2015-11-10 DIAGNOSIS — J209 Acute bronchitis, unspecified: Secondary | ICD-10-CM

## 2015-11-10 DIAGNOSIS — Z720 Tobacco use: Secondary | ICD-10-CM

## 2015-11-10 MED ORDER — TIOTROPIUM BROMIDE MONOHYDRATE 1.25 MCG/ACT IN AERS: 1.0000 "application " | INHALATION_SPRAY | Freq: Every day | RESPIRATORY_TRACT | 11 refills | Status: DC

## 2015-11-10 MED ORDER — AMOXICILLIN-POT CLAVULANATE 875-125 MG PO TABS: 1.0000 | ORAL_TABLET | Freq: Two times a day (BID) | ORAL | 0 refills | Status: DC

## 2015-11-10 NOTE — Progress Notes (Signed)
   Subjective:    Patient ID: Jessica Townsend, female    DOB: August 04, 1950, 65 y.o.   MRN: [id number]  HPI She is here for consult concerning a six-day history of started with myalgias and cough that has become productive of a whitish sputum with chills and sweats but no fever, sore throat, earache or shortness of breath. She does admit to smoking a few cigarettes. She continues on Symbicort as well as albuterol. She notes she is using the albuterol more for her wheezing. She states she is using her albuterol at least twice per week stating she cannot go outside without her asthma given her trouble.   Review of Systems     Objective:   Physical Exam Alert and in no distress. Tympanic membranes and canals are normal. Pharyngeal area is normal. Neck is supple without adenopathy or thyromegaly. Cardiac exam shows a regular sinus rhythm without murmurs or gallops. Lungs are clear to auscultation.        Assessment & Plan:  Acute bronchitis, unspecified organism - Plan: amoxicillin-clavulanate (AUGMENTIN) 875-125 MG tablet  Chronic obstructive pulmonary disease, unspecified COPD type (HCC) - Plan: Tiotropium Bromide Monohydrate (SPIRIVA RESPIMAT) 1.25 MCG/ACT AERS  Asthma, mild persistent, uncomplicated - Plan: Tiotropium Bromide Monohydrate (SPIRIVA RESPIMAT) 1.25 MCG/ACT AERS  Current occasional smoker I will start her back on Spiriva. She will call if that particular variety is too expensive. I ask her to check with the pharmacist to see if there is a less expensive cost preparation. She will call if further difficulty.

## 2015-12-16 ENCOUNTER — Ambulatory Visit (INDEPENDENT_AMBULATORY_CARE_PROVIDER_SITE_OTHER): Payer: Medicare Other | Admitting: Family Medicine

## 2015-12-16 ENCOUNTER — Encounter: Payer: Self-pay | Admitting: Family Medicine

## 2015-12-16 VITALS — BP 126/82 | HR 96 | Wt 134.0 lb

## 2015-12-16 DIAGNOSIS — J4540 Moderate persistent asthma, uncomplicated: Secondary | ICD-10-CM | POA: Diagnosis not present

## 2015-12-16 DIAGNOSIS — J449 Chronic obstructive pulmonary disease, unspecified: Secondary | ICD-10-CM

## 2015-12-16 DIAGNOSIS — Z23 Encounter for immunization: Secondary | ICD-10-CM | POA: Diagnosis not present

## 2015-12-16 NOTE — Progress Notes (Signed)
   Subjective:    Patient ID: Jessica Townsend, female    DOB: 1950/12/03, 65 y.o.   MRN: [id number]  HPI She is here for consult concerning a possible allergic reaction. It was difficult to get a good history from rest she tended to ramble and give her assessment rather than symptoms. She describes several episodes of eating food and within 15-30 minutes will then develop chest pain, coughing, shortness of breath, chest tightness. On one occasion she did note her PO2 dropping down to 79. She does have underlying allergies. She states she doesn't smoke but always comes in smelling of smoke. She's had no associated nausea, vomiting, diarrhea or rash with this. She states that she has noted reactions to several different foods and thinks that this is all an allergic reaction.   Review of Systems     Objective:   Physical Exam Alert and in no distress. Tympanic membranes and canals are normal. Pharyngeal area is normal. Neck is supple without adenopathy or thyromegaly. Cardiac exam shows a regular sinus rhythm without murmurs or gallops. Lungs show mild wheezing        Assessment & Plan:  Moderate persistent asthma without complication - Plan: Ambulatory referral to Allergy, CBC with Differential/Platelet, Comprehensive metabolic panel, Nicotine/cotinine metabolites  Chronic obstructive pulmonary disease, unspecified COPD type (HCC) - Plan: Ambulatory referral to Allergy, CBC with Differential/Platelet, Comprehensive metabolic panel, Nicotine/cotinine metabolites  Need for prophylactic vaccination and inoculation against influenza - Plan: Flu Vaccine QUAD 36+ mos IM Melynda would like to see an allergist to get this further evaluated. I am not sure whether this is truly an allergy versus an adverse reaction versus her imagination. I will also check a nicotine level as I'm not comfortable with her only saying she is not smoking anymore.

## 2015-12-17 ENCOUNTER — Telehealth: Payer: Self-pay

## 2015-12-17 LAB — COMPREHENSIVE METABOLIC PANEL
ALBUMIN: 4.4 g/dL (ref 3.6–5.1)
ALK PHOS: 84 U/L (ref 33–130)
ALT: 20 U/L (ref 6–29)
AST: 19 U/L (ref 10–35)
BILIRUBIN TOTAL: 0.8 mg/dL (ref 0.2–1.2)
BUN: 22 mg/dL (ref 7–25)
CALCIUM: 9.9 mg/dL (ref 8.6–10.4)
CO2: 23 mmol/L (ref 20–31)
Chloride: 102 mmol/L (ref 98–110)
Creat: 1.31 mg/dL — ABNORMAL HIGH (ref 0.50–0.99)
Glucose, Bld: 92 mg/dL (ref 65–99)
Potassium: 4.8 mmol/L (ref 3.5–5.3)
Sodium: 139 mmol/L (ref 135–146)
Total Protein: 7 g/dL (ref 6.1–8.1)

## 2015-12-17 LAB — CBC WITH DIFFERENTIAL/PLATELET
BASOS PCT: 1 %
Basophils Absolute: 105 cells/uL (ref 0–200)
Eosinophils Absolute: 420 cells/uL (ref 15–500)
Eosinophils Relative: 4 %
HEMATOCRIT: 45.6 % — AB (ref 35.0–45.0)
HEMOGLOBIN: 15.7 g/dL — AB (ref 11.7–15.5)
LYMPHS ABS: 2415 {cells}/uL (ref 850–3900)
Lymphocytes Relative: 23 %
MCH: 31 pg (ref 27.0–33.0)
MCHC: 34.4 g/dL (ref 32.0–36.0)
MCV: 90.1 fL (ref 80.0–100.0)
MONO ABS: 840 {cells}/uL (ref 200–950)
MPV: 10.1 fL (ref 7.5–12.5)
Monocytes Relative: 8 %
NEUTROS PCT: 64 %
Neutro Abs: 6720 cells/uL (ref 1500–7800)
Platelets: 301 10*3/uL (ref 140–400)
RBC: 5.06 MIL/uL (ref 3.80–5.10)
RDW: 14.7 % (ref 11.0–15.0)
WBC: 10.5 10*3/uL (ref 4.0–10.5)

## 2015-12-17 NOTE — Telephone Encounter (Signed)
Pt called the office with question of what she can do to help reduce the pain in her chest when she coughs. Advised pt to hug a pillow when she coughs. Do you have any advice on what she can do in the meantime?

## 2015-12-17 NOTE — Telephone Encounter (Signed)
2 Aleve twice per day 

## 2015-12-18 LAB — NICOTINE/COTININE METABOLITES: Cotinine: POSITIVE — AB

## 2015-12-18 NOTE — Telephone Encounter (Signed)
Left word for word message vm

## 2016-01-14 ENCOUNTER — Ambulatory Visit: Payer: Medicare Other | Admitting: Allergy and Immunology

## 2016-03-03 ENCOUNTER — Other Ambulatory Visit: Payer: Self-pay | Admitting: Family Medicine

## 2016-03-03 DIAGNOSIS — J4530 Mild persistent asthma, uncomplicated: Secondary | ICD-10-CM

## 2016-06-15 ENCOUNTER — Telehealth: Payer: Self-pay | Admitting: Family Medicine

## 2016-06-15 NOTE — Telephone Encounter (Signed)
Called and left message to call our office to schedule a Medicare Wellness Visit

## 2016-07-13 ENCOUNTER — Telehealth: Payer: Self-pay | Admitting: Family Medicine

## 2016-07-13 NOTE — Telephone Encounter (Signed)
Pt called requesting a sample of symbircort ok to give per dr Susann Givenslalonde

## 2016-09-04 ENCOUNTER — Inpatient Hospital Stay (HOSPITAL_COMMUNITY)
Admission: EM | Admit: 2016-09-04 | Discharge: 2016-09-06 | DRG: 190 | Disposition: A | Discharge status: Home / Self Care | Payer: Medicare Other | Attending: Internal Medicine | Admitting: Internal Medicine

## 2016-09-04 ENCOUNTER — Encounter (HOSPITAL_COMMUNITY): Payer: Self-pay | Admitting: Family Medicine

## 2016-09-04 ENCOUNTER — Emergency Department (HOSPITAL_COMMUNITY): Payer: Medicare Other

## 2016-09-04 DIAGNOSIS — Z9109 Other allergy status, other than to drugs and biological substances: Secondary | ICD-10-CM

## 2016-09-04 DIAGNOSIS — Z91040 Latex allergy status: Secondary | ICD-10-CM | POA: Diagnosis not present

## 2016-09-04 DIAGNOSIS — J4530 Mild persistent asthma, uncomplicated: Secondary | ICD-10-CM | POA: Diagnosis not present

## 2016-09-04 DIAGNOSIS — J9601 Acute respiratory failure with hypoxia: Secondary | ICD-10-CM | POA: Diagnosis present

## 2016-09-04 DIAGNOSIS — Z7952 Long term (current) use of systemic steroids: Secondary | ICD-10-CM | POA: Diagnosis not present

## 2016-09-04 DIAGNOSIS — Z79899 Other long term (current) drug therapy: Secondary | ICD-10-CM | POA: Diagnosis not present

## 2016-09-04 DIAGNOSIS — J449 Chronic obstructive pulmonary disease, unspecified: Secondary | ICD-10-CM

## 2016-09-04 DIAGNOSIS — Z23 Encounter for immunization: Secondary | ICD-10-CM | POA: Diagnosis not present

## 2016-09-04 DIAGNOSIS — Z87891 Personal history of nicotine dependence: Secondary | ICD-10-CM | POA: Diagnosis not present

## 2016-09-04 DIAGNOSIS — Z888 Allergy status to other drugs, medicaments and biological substances status: Secondary | ICD-10-CM | POA: Diagnosis not present

## 2016-09-04 DIAGNOSIS — N182 Chronic kidney disease, stage 2 (mild): Secondary | ICD-10-CM | POA: Diagnosis not present

## 2016-09-04 DIAGNOSIS — R0682 Tachypnea, not elsewhere classified: Secondary | ICD-10-CM | POA: Diagnosis not present

## 2016-09-04 DIAGNOSIS — Z91018 Allergy to other foods: Secondary | ICD-10-CM | POA: Diagnosis not present

## 2016-09-04 DIAGNOSIS — Z825 Family history of asthma and other chronic lower respiratory diseases: Secondary | ICD-10-CM

## 2016-09-04 DIAGNOSIS — Z91010 Allergy to peanuts: Secondary | ICD-10-CM

## 2016-09-04 DIAGNOSIS — Z66 Do not resuscitate: Secondary | ICD-10-CM | POA: Diagnosis present

## 2016-09-04 DIAGNOSIS — R000 Tachycardia, unspecified: Secondary | ICD-10-CM | POA: Diagnosis not present

## 2016-09-04 DIAGNOSIS — R0602 Shortness of breath: Secondary | ICD-10-CM | POA: Diagnosis not present

## 2016-09-04 DIAGNOSIS — J441 Chronic obstructive pulmonary disease with (acute) exacerbation: Secondary | ICD-10-CM | POA: Diagnosis not present

## 2016-09-04 LAB — CBC WITH DIFFERENTIAL/PLATELET
BASOS PCT: 1 %
Basophils Absolute: 0.1 10*3/uL (ref 0.0–0.1)
EOS ABS: 0.1 10*3/uL (ref 0.0–0.7)
EOS PCT: 1 %
HCT: 41.9 % (ref 36.0–46.0)
Hemoglobin: 14.4 g/dL (ref 12.0–15.0)
LYMPHS ABS: 1.9 10*3/uL (ref 0.7–4.0)
Lymphocytes Relative: 16 %
MCH: 31.3 pg (ref 26.0–34.0)
MCHC: 34.4 g/dL (ref 30.0–36.0)
MCV: 91.1 fL (ref 78.0–100.0)
MONO ABS: 1.1 10*3/uL — AB (ref 0.1–1.0)
MONOS PCT: 9 %
Neutro Abs: 9.1 10*3/uL — ABNORMAL HIGH (ref 1.7–7.7)
Neutrophils Relative %: 73 %
Platelets: 289 10*3/uL (ref 150–400)
RBC: 4.6 MIL/uL (ref 3.87–5.11)
RDW: 14.1 % (ref 11.5–15.5)
WBC: 12.3 10*3/uL — ABNORMAL HIGH (ref 4.0–10.5)

## 2016-09-04 LAB — I-STAT VENOUS BLOOD GAS, ED
ACID-BASE EXCESS: 4 mmol/L — AB (ref 0.0–2.0)
Bicarbonate: 30.4 mmol/L — ABNORMAL HIGH (ref 20.0–28.0)
O2 SAT: 31 %
PCO2 VEN: 53.3 mmHg (ref 44.0–60.0)
PH VEN: 7.365 (ref 7.250–7.430)
PO2 VEN: 21 mmHg — AB (ref 32.0–45.0)
Patient temperature: 98.7
TCO2: 32 mmol/L (ref 0–100)

## 2016-09-04 LAB — BASIC METABOLIC PANEL
Anion gap: 12 (ref 5–15)
BUN: 8 mg/dL (ref 6–20)
CALCIUM: 8.9 mg/dL (ref 8.9–10.3)
CHLORIDE: 102 mmol/L (ref 101–111)
CO2: 24 mmol/L (ref 22–32)
CREATININE: 1.19 mg/dL — AB (ref 0.44–1.00)
GFR calc non Af Amer: 47 mL/min — ABNORMAL LOW (ref >60)
GFR, EST AFRICAN AMERICAN: 54 mL/min — AB (ref >60)
Glucose, Bld: 122 mg/dL — ABNORMAL HIGH (ref 65–99)
Potassium: 4.2 mmol/L (ref 3.5–5.1)
SODIUM: 138 mmol/L (ref 135–145)

## 2016-09-04 LAB — PROCALCITONIN: Procalcitonin: <0.1 ng/mL

## 2016-09-04 MED ORDER — ALBUTEROL (5 MG/ML) CONTINUOUS INHALATION SOLN
INHALATION_SOLUTION | RESPIRATORY_TRACT | Status: AC
  Filled 2016-09-04: qty 20

## 2016-09-04 MED ORDER — SENNOSIDES-DOCUSATE SODIUM 8.6-50 MG PO TABS: 1.0000 | ORAL_TABLET | Freq: Every evening | ORAL | Status: DC | PRN

## 2016-09-04 MED ORDER — DEXTROMETHORPHAN POLISTIREX ER 30 MG/5ML PO SUER: 15.0000 mg | Freq: Four times a day (QID) | ORAL | Status: DC | PRN

## 2016-09-04 MED ORDER — METHYLPREDNISOLONE SODIUM SUCC 125 MG IJ SOLR
60.0000 mg | Freq: Four times a day (QID) | INTRAMUSCULAR | Status: DC
  Administered 2016-09-04 – 2016-09-06 (×6): 60 mg via INTRAVENOUS
  Filled 2016-09-04 (×6): qty 2

## 2016-09-04 MED ORDER — BISACODYL 5 MG PO TBEC: 5.0000 mg | DELAYED_RELEASE_TABLET | Freq: Every day | ORAL | Status: DC | PRN

## 2016-09-04 MED ORDER — ACETAMINOPHEN 650 MG RE SUPP: 650.0000 mg | Freq: Four times a day (QID) | RECTAL | Status: DC | PRN

## 2016-09-04 MED ORDER — MOMETASONE FURO-FORMOTEROL FUM 200-5 MCG/ACT IN AERO
2.0000 | INHALATION_SPRAY | Freq: Two times a day (BID) | RESPIRATORY_TRACT | Status: DC
  Administered 2016-09-05 – 2016-09-06 (×4): 2 via RESPIRATORY_TRACT
  Filled 2016-09-04: qty 8.8

## 2016-09-04 MED ORDER — HYDROCODONE-ACETAMINOPHEN 5-325 MG PO TABS
1.0000 | ORAL_TABLET | ORAL | Status: DC | PRN
  Administered 2016-09-05 (×3): 2 via ORAL
  Filled 2016-09-04 (×3): qty 2

## 2016-09-04 MED ORDER — ACETAMINOPHEN 325 MG PO TABS: 650.0000 mg | ORAL_TABLET | Freq: Four times a day (QID) | ORAL | Status: DC | PRN

## 2016-09-04 MED ORDER — LORAZEPAM 2 MG/ML IJ SOLN
1.0000 mg | Freq: Once | INTRAMUSCULAR | Status: AC
  Administered 2016-09-04: 1 mg via INTRAVENOUS
  Filled 2016-09-04: qty 1

## 2016-09-04 MED ORDER — ONDANSETRON HCL 4 MG/2ML IJ SOLN: 4.0000 mg | Freq: Four times a day (QID) | INTRAMUSCULAR | Status: DC | PRN

## 2016-09-04 MED ORDER — ALBUTEROL (5 MG/ML) CONTINUOUS INHALATION SOLN
5.0000 mg/h | INHALATION_SOLUTION | Freq: Once | RESPIRATORY_TRACT | Status: AC
  Administered 2016-09-04: 20:00:00 via RESPIRATORY_TRACT
  Administered 2016-09-04: 10 mg/h via RESPIRATORY_TRACT

## 2016-09-04 MED ORDER — IPRATROPIUM-ALBUTEROL 0.5-2.5 (3) MG/3ML IN SOLN
3.0000 mL | RESPIRATORY_TRACT | Status: DC | PRN
  Administered 2016-09-05 – 2016-09-06 (×5): 3 mL via RESPIRATORY_TRACT
  Filled 2016-09-04 (×7): qty 3

## 2016-09-04 MED ORDER — SODIUM CHLORIDE 0.9% FLUSH
3.0000 mL | Freq: Two times a day (BID) | INTRAVENOUS | Status: DC
  Administered 2016-09-04 – 2016-09-05 (×3): 3 mL via INTRAVENOUS

## 2016-09-04 MED ORDER — DEXTROSE 5 % IV SOLN
500.0000 mg | INTRAVENOUS | Status: DC
  Administered 2016-09-04 – 2016-09-05 (×2): 500 mg via INTRAVENOUS
  Filled 2016-09-04 (×2): qty 500

## 2016-09-04 MED ORDER — ONDANSETRON HCL 4 MG PO TABS: 4.0000 mg | ORAL_TABLET | Freq: Four times a day (QID) | ORAL | Status: DC | PRN

## 2016-09-04 MED ORDER — SODIUM CHLORIDE 0.9 % IV SOLN
INTRAVENOUS | Status: AC
  Administered 2016-09-04 – 2016-09-05 (×2): via INTRAVENOUS

## 2016-09-04 MED ORDER — IPRATROPIUM BROMIDE 0.02 % IN SOLN
RESPIRATORY_TRACT | Status: AC
  Administered 2016-09-04: 0.5 mg
  Filled 2016-09-04: qty 5

## 2016-09-04 NOTE — ED Triage Notes (Signed)
Ems pt from home called out for resp distress, pt took 4 breathing treatments at home prior to callling ems. meds given by ems prior to arrival, 10 albuterol, 0.5 atrovent, 2 grams mag, 125mg  solumedrol

## 2016-09-04 NOTE — H&P (Signed)
History and Physical    Jessica MatasMalinda L Ceasar ZOX:[id number] DOB: November 05, 1950 DOA: 09/04/2016  PCP: Ronnald NianLalonde, [name], MD   Patient coming from: Home  Chief Complaint: SOB, productive cough, wheezing  HPI: Jessica Townsend is a 66 y.o. female with medical history significant for COPD and chronic kidney disease stage II, now presenting to the emergency department for evaluation of dyspnea with productive cough. Patient reports that began experiencing a worsening in his chronic dyspnea several days ago, associated with a cough productive of thick clear sputum, as well as wheezing. Patient states that the symptoms worsened acutely today and was unable to find any significant relief despite using his albuterol nebulizer 4 times at home. Patient denies any associated chest pain or palpitations and denies any lower extremity swelling, tenderness, or orthopnea. No recent long distance travel or sick contacts. Patient reports experiencing similar symptoms previously, but not in the past couple years. EMS was called and treated the patient with DuoNeb, 2 g of IV magnesium, and 125 mg IV Solu-Medrol while en route to the hospital. He was placed on 10 L per minute of supplemental oxygen.  ED Course: Upon arrival to the ED, patient is found to be saturating 99% on 10 L/m supplemental oxygen, tachypneic, tachycardic, stable blood pressure. EKG features a sinus tachycardia with rate 116. Chest x-ray findings are consistent with COPD, but no acute processes identified. Chemistry panels notable for serum creatinine 1.19 which appears consistent with his baseline. CBC is notable for a leukocytosis to 12,300. Patient was given a 5 mg continuous albuterol treatment in the ED and also treated with 1 mg IV Ativan. Patient was weaned from supplemental oxygen, continues to saturate in the 90s on room air, but remains tachypneic and tachycardic with audible wheezes. Patient will be admitted to the telemetry unit for ongoing  evaluation and management of acute hypoxic respiratory failure suspected secondary to exacerbation and COPD.  Review of Systems:  All other systems reviewed and apart from HPI, are negative.  Past Medical History:  Diagnosis Date  . Asthma   . COPD (chronic obstructive pulmonary disease) (HCC)   . Mood disorder (HCC)   . Smoker   . Trans-sexualism     History reviewed. No pertinent surgical history.   reports that she has quit smoking. Her smoking use included Cigarettes. She has a 4.00 pack-year smoking history. She has never used smokeless tobacco. She reports that she does not drink alcohol or use drugs.  Allergies  Allergen Reactions  . Antihistamines, Diphenhydramine-Type Anaphylaxis and Shortness Of Breath    CANNOT TOLERATE ANY!!  . Beef-Derived Products Anaphylaxis  . Chicken Protein Anaphylaxis  . Monosodium Glutamate Anaphylaxis  . Other Anaphylaxis and Shortness Of Breath    CANNED FRUITS, MOST SPICES, CHICKEN AND BEEF GREASE AND FAT, VEGETABLE OIL, PORK FAT   . Peanuts [Peanut Oil] Anaphylaxis    "I DIE"  . Pork-Derived Products Anaphylaxis  . Sulfate Anaphylaxis  . Latex Rash  . Tape Rash    Band-aids are the worst     Family History  Problem Relation Age of Onset  . Hypertension Mother   . Hypertension Father   . Stroke Father   . Ulcers Father   . Heart attack Father   . Asthma Sister   . Diabetes Paternal Uncle   . Cancer Maternal Grandmother   . Asthma Maternal Grandfather      Prior to Admission medications   Medication Sig Start Date End Date Taking? Authorizing Provider  albuterol (PROVENTIL) (2.5 MG/3ML) 0.083% nebulizer solution INHALE 1 VIAL IN NEBULIZATION EVERY 6 HOURS AS NEEDED FOR WHEEZING OR SHORTNESS OF BREATH 11/07/15  Yes [name], MD  budesonide-formoterol [facility]) 160-4.5 MCG/ACT inhaler Inhale 2 puffs into the lungs 2 (two) times daily. 03/03/16  Yes [name], MD  dextromethorphan (DELSYM) 30 MG/5ML liquid Take 15-30  mg by mouth every 6 (six) hours as needed for cough.   Yes [provider]  amoxicillin-clavulanate (AUGMENTIN) 875-125 MG tablet Take 1 tablet by mouth 2 (two) times daily. Patient not taking: Reported on 12/16/2015 11/10/15   [name], MD  Tiotropium Bromide Monohydrate (SPIRIVA RESPIMAT) 1.25 MCG/ACT AERS Inhale 1 application into the lungs daily. Patient not taking: Reported on 09/04/2016 11/10/15   [name], MD    Physical Exam: Vitals:   09/04/16 2000 09/04/16 2002  BP: (!) 143/87   Resp: (!) 27   SpO2:  99%      Constitutional: In respiratory distress with tachypnea, accessory muscle recruitment. No pallor. No diaphoresis.  Eyes: PERTLA, lids and conjunctivae normal ENMT: Mucous membranes are moist. Posterior pharynx clear of any exudate or lesions.   Neck: normal, supple, no masses, no thyromegaly Respiratory: Breath sounds diminished bilaterally. Wheezes throughout on expiration; prolonged expiratory phase. Increased WOB. No pallor or cyanosis.   Cardiovascular: Rate ~120 and regular. No extremity edema. No significant JVD. Abdomen: No distension, no tenderness, no masses palpated. Bowel sounds normal.  Musculoskeletal: no clubbing / cyanosis. No joint deformity upper and lower extremities.    Skin: no significant rashes, lesions, ulcers. Warm, dry, well-perfused. Neurologic: CN 2-12 grossly intact. Sensation intact, DTR normal. Strength 5/5 in all 4 limbs.  Psychiatric: Alert and oriented x 3. Odd affect, cooperative.     Labs on Admission: I have personally reviewed following labs and imaging studies  CBC:  Recent Labs Lab 09/04/16 2001  WBC 12.3*  NEUTROABS 9.1*  HGB 14.4  HCT 41.9  MCV 91.1  PLT 289   Basic Metabolic Panel:  Recent Labs Lab 09/04/16 2001  NA 138  K 4.2  CL 102  CO2 24  GLUCOSE 122*  BUN 8  CREATININE 1.19*  CALCIUM 8.9   GFR: CrCl cannot be calculated (Unknown ideal weight.). Liver Function Tests: No  results for input(s): AST, ALT, ALKPHOS, BILITOT, PROT, ALBUMIN in the last 168 hours. No results for input(s): LIPASE, AMYLASE in the last 168 hours. No results for input(s): AMMONIA in the last 168 hours. Coagulation Profile: No results for input(s): INR, PROTIME in the last 168 hours. Cardiac Enzymes: No results for input(s): CKTOTAL, CKMB, CKMBINDEX, TROPONINI in the last 168 hours. BNP (last 3 results) No results for input(s): PROBNP in the last 8760 hours. HbA1C: No results for input(s): HGBA1C in the last 72 hours. CBG: No results for input(s): GLUCAP in the last 168 hours. Lipid Profile: No results for input(s): CHOL, HDL, LDLCALC, TRIG, CHOLHDL, LDLDIRECT in the last 72 hours. Thyroid Function Tests: No results for input(s): TSH, T4TOTAL, FREET4, T3FREE, THYROIDAB in the last 72 hours. Anemia Panel: No results for input(s): VITAMINB12, FOLATE, FERRITIN, TIBC, IRON, RETICCTPCT in the last 72 hours. Urine analysis:    Component Value Date/Time   COLORURINE YELLOW 11/06/2014 1510   APPEARANCEUR CLEAR 11/06/2014 1510   LABSPEC 1.019 11/06/2014 1510   PHURINE 5.0 11/06/2014 1510   GLUCOSEU NEGATIVE 11/06/2014 1510   HGBUR NEGATIVE 11/06/2014 1510   BILIRUBINUR NEGATIVE 11/06/2014 1510   KETONESUR 15 (A) 11/06/2014 1510  PROTEINUR NEGATIVE 11/06/2014 1510   UROBILINOGEN 0.2 11/06/2014 1510   NITRITE NEGATIVE 11/06/2014 1510   LEUKOCYTESUR NEGATIVE 11/06/2014 1510   Sepsis Labs: @LABRCNTIP (procalcitonin:4,lacticidven:4) )No results found for this or any previous visit (from the past 240 hour(s)).   Radiological Exams on Admission: Dg Chest Portable 1 View  Result Date: 09/04/2016 CLINICAL DATA:  66 y/o  F; shortness of breath. EXAM: PORTABLE CHEST 1 VIEW COMPARISON:  11/06/2014 chest radiograph FINDINGS: Stable normal cardiac silhouette. Aortic atherosclerosis with calcification. Probable nipple shadow projecting over right sixth posterolateral rib. Hyperinflated lungs and  flattened diaphragms compatible with COPD. No focal consolidation. No pleural effusion or pneumothorax. Bones are unremarkable. IMPRESSION: COPD.  No acute pulmonary process identified. Electronically Signed   By: Mitzi Hansen M.D.   On: 09/04/2016 20:22    EKG: Independently reviewed. Sinus tachycardia (rate 116).   Assessment/Plan  1. COPD with acute exacerbation  - Pt presents with progressive dyspnea, wheezing, productive cough despite frequent use of albuterol neb at home   - Treated with DuoNeb, 125 mg IV Solu-Medrol, 2 g IV magnesium, and 10 Lpm supplemental O2 while en route  - Given continuous albuterol neb in ED and was weaned from supplemental O2, but continues to have labored breathing and distress while at rest  - Plan to continue scheduled ICS/LABA, systemic steroid, and prn nebs; check sputum culture and start azithromycin  - Supportive care with prn supplemental O2 and prn Delsym    2. Acute hypoxic respiratory failure  - Pt in acute distress initially, hypoxemia on blood gas  - There is a mild leukocytosis, but pt denies fever/chillls and no infiltrate noted CXR  - No LE edema, JVD, or orthopnea - No leg swelling, immobilization, chest pain, or hx of VTE  - Suspect this is secondary to dx #1, treating as above   3. CKD stage II  - SCr is 1.19 on admission, consistent with apparent baseline  - He appears dehydrated on presentation and is provided IVF hydration; repeat chem panel in am   DVT prophylaxis: sq Lovenox  Code Status: DNR  Family Communication: Discussed with patient Disposition Plan: Admit to telemetry Consults called: None Admission status: Inpatient    Briscoe Deutscher, MD [facility] Pager [phone]  If 7PM-7AM, please contact night-coverage [email or web address] Password [email or web address]  09/04/2016, 9:59 PM

## 2016-09-04 NOTE — ED Notes (Signed)
Attempted report 

## 2016-09-04 NOTE — ED Provider Notes (Signed)
[facility]-EMERGENCY DEPT Provider Note   CSN: [id number] Arrival date & time: 09/04/16  1952     History   Chief Complaint Chief Complaint  Patient presents with  . Shortness of Breath    HPI Jessica Townsend is a 66 y.o. female.  The history is provided by the patient and the EMS personnel.  Illness  This is a chronic problem. The current episode started 6 to 12 hours ago. The problem occurs constantly. The problem has been gradually improving. Associated symptoms include shortness of breath. Pertinent negatives include no chest pain, no abdominal pain and no headaches. Associated symptoms comments: Coughing, wheezing. Nothing aggravates the symptoms. Relieved by: Albuterol, DuoNeb's.    Past Medical History:  Diagnosis Date  . Asthma   . COPD (chronic obstructive pulmonary disease) (HCC)   . Mood disorder (HCC)   . Smoker   . Trans-sexualism     Patient Active Problem List   Diagnosis Date Noted  . Acute respiratory failure with hypoxia (HCC) 09/04/2016  . CKD (chronic kidney disease), stage II 09/04/2016  . COPD with acute exacerbation (HCC) 11/14/2014  . Asthma 05/31/2011  . Current occasional smoker 05/31/2011    History reviewed. No pertinent surgical history.  OB History    No data available       Home Medications    Prior to Admission medications   Medication Sig Start Date End Date Taking? Authorizing Provider  albuterol (PROVENTIL) (2.5 MG/3ML) 0.083% nebulizer solution INHALE 1 VIAL IN NEBULIZATION EVERY 6 HOURS AS NEEDED FOR WHEEZING OR SHORTNESS OF BREATH 11/07/15  Yes [name], MD  budesonide-formoterol [facility]) 160-4.5 MCG/ACT inhaler Inhale 2 puffs into the lungs 2 (two) times daily. 03/03/16  Yes [name], MD  dextromethorphan (DELSYM) 30 MG/5ML liquid Take 15-30 mg by mouth every 6 (six) hours as needed for cough.   Yes [provider]  amoxicillin-clavulanate (AUGMENTIN) 875-125 MG tablet Take 1 tablet by mouth 2 (two)  times daily. Patient not taking: Reported on 12/16/2015 11/10/15   [name], MD  Tiotropium Bromide Monohydrate (SPIRIVA RESPIMAT) 1.25 MCG/ACT AERS Inhale 1 application into the lungs daily. Patient not taking: Reported on 09/04/2016 11/10/15   [name], MD    Family History Family History  Problem Relation Age of Onset  . Hypertension Mother   . Hypertension Father   . Stroke Father   . Ulcers Father   . Heart attack Father   . Asthma Sister   . Diabetes Paternal Uncle   . Cancer Maternal Grandmother   . Asthma Maternal Grandfather     Social History Social History  Substance Use Topics  . Smoking status: Former Smoker    Packs/day: 0.20    Years: 20.00    Types: Cigarettes  . Smokeless tobacco: Never Used  . Alcohol use No     Allergies   Antihistamines, diphenhydramine-type; Beef-derived products; Chicken protein; Monosodium glutamate; Other; Peanuts [peanut oil]; Pork-derived products; Sulfate; Latex; and Tape   Review of Systems Review of Systems  Constitutional: Negative for chills and fever.  HENT: Negative for ear pain and sore throat.   Eyes: Negative for pain and visual disturbance.  Respiratory: Positive for chest tightness, shortness of breath and wheezing. Negative for cough.   Cardiovascular: Negative for chest pain and palpitations.  Gastrointestinal: Negative for abdominal pain and vomiting.  Genitourinary: Negative for dysuria and hematuria.  Musculoskeletal: Negative for arthralgias and back pain.  Skin: Negative for color change and rash.  Neurological: Negative for seizures, syncope and headaches.  Hematological: Does not bruise/bleed easily.  Psychiatric/Behavioral: Negative for agitation and behavioral problems.  All other systems reviewed and are negative.    Physical Exam Updated Vital Signs BP 119/62 (BP Location: Left Arm)   Pulse (!) 122   Temp 98.2 F (36.8 C) (Oral)   Resp (!) 22   Ht 6\' 1"  (1.854 m)   Wt 65.7 kg  (144 lb 12.8 oz) Comment: c scale  SpO2 96%   BMI 19.10 kg/m   Physical Exam  Constitutional: She is oriented to person, place, and time. She appears well-developed and well-nourished.  HENT:  Head: Normocephalic and atraumatic.  Right Ear: External ear normal.  Left Ear: External ear normal.  Eyes: Conjunctivae and EOM are normal. Pupils are equal, round, and reactive to light.  Neck: Normal range of motion. No tracheal deviation present.  Cardiovascular: Normal rate and intact distal pulses.   Pulmonary/Chest: She has wheezes.  Patient with marked tachypnea upon arrival with bilateral inspiratory and expiratory wheezing. Abdominal retractions present  Abdominal: Soft. She exhibits no distension. There is no tenderness. There is no guarding.  Musculoskeletal: She exhibits no tenderness or deformity.  Neurological: She is alert and oriented to person, place, and time.  Skin: Skin is warm and dry.  Psychiatric: She has a normal mood and affect.     ED Treatments / Results  Labs (all labs ordered are listed, but only abnormal results are displayed) Labs Reviewed  CBC WITH DIFFERENTIAL/PLATELET - Abnormal; Notable for the following:       Result Value   WBC 12.3 (*)    Neutro Abs 9.1 (*)    Monocytes Absolute 1.1 (*)    All other components within normal limits  BASIC METABOLIC PANEL - Abnormal; Notable for the following:    Glucose, Bld 122 (*)    Creatinine, Ser 1.19 (*)    GFR calc non Af Amer 47 (*)    GFR calc Af Amer 54 (*)    All other components within normal limits  I-STAT VENOUS BLOOD GAS, ED - Abnormal; Notable for the following:    pO2, Ven 21.0 (*)    Bicarbonate 30.4 (*)    Acid-Base Excess 4.0 (*)    All other components within normal limits  CULTURE, EXPECTORATED SPUTUM-ASSESSMENT  PROCALCITONIN  HIV ANTIBODY (ROUTINE TESTING)  BASIC METABOLIC PANEL  CBC WITH DIFFERENTIAL/PLATELET    EKG  EKG Interpretation  Date/Time:  Saturday September 04 2016  19:57:15 EDT Ventricular Rate:  116 PR Interval:    QRS Duration: 96 QT Interval:  308 QTC Calculation: 428 R Axis:   98 Text Interpretation:  Sinus tachycardia Biatrial enlargement Probable lateral infarct, old Artifact in lead(s) I II III aVR aVL aVF V1 V2 V3 V4 V5 V6 Confirmed by Jacalyn Lefevre [phone]) on 09/04/2016 8:04:55 PM       Radiology Dg Chest Portable 1 View  Result Date: 09/04/2016 CLINICAL DATA:  66 y/o  F; shortness of breath. EXAM: PORTABLE CHEST 1 VIEW COMPARISON:  11/06/2014 chest radiograph FINDINGS: Stable normal cardiac silhouette. Aortic atherosclerosis with calcification. Probable nipple shadow projecting over right sixth posterolateral rib. Hyperinflated lungs and flattened diaphragms compatible with COPD. No focal consolidation. No pleural effusion or pneumothorax. Bones are unremarkable. IMPRESSION: COPD.  No acute pulmonary process identified. Electronically Signed   By: Mitzi Hansen M.D.   On: 09/04/2016 20:22    Procedures Procedures (including critical care time)  Medications Ordered in ED Medications  dextromethorphan (DELSYM) 30 MG/5ML liquid 15-30 mg (not administered)  mometasone-formoterol (DULERA) 200-5 MCG/ACT inhaler 2 puff (2 puffs Inhalation Given 09/05/16 0006)  sodium chloride flush (NS) 0.9 % injection 3 mL (3 mLs Intravenous Given 09/04/16 2233)  0.9 %  sodium chloride infusion ( Intravenous New Bag/Given 09/04/16 2233)  acetaminophen (TYLENOL) tablet 650 mg (not administered)    Or  acetaminophen (TYLENOL) suppository 650 mg (not administered)  HYDROcodone-acetaminophen (NORCO/VICODIN) 5-325 MG per tablet 1-2 tablet (2 tablets Oral Given 09/05/16 0002)  senna-docusate (Senokot-S) tablet 1 tablet (not administered)  bisacodyl (DULCOLAX) EC tablet 5 mg (not administered)  ondansetron (ZOFRAN) tablet 4 mg (not administered)    Or  ondansetron (ZOFRAN) injection 4 mg (not administered)  ipratropium-albuterol (DUONEB) 0.5-2.5 (3) MG/3ML  nebulizer solution 3 mL (3 mLs Nebulization Given 09/05/16 0011)  methylPREDNISolone sodium succinate (SOLU-MEDROL) 125 mg/2 mL injection 60 mg (60 mg Intravenous Given 09/04/16 2231)  azithromycin (ZITHROMAX) 500 mg in dextrose 5 % 250 mL IVPB (0 mg Intravenous Stopped 09/04/16 2334)  ipratropium (ATROVENT) 0.02 % nebulizer solution (0.5 mg  Given by Other 09/04/16 2000)  LORazepam (ATIVAN) injection 1 mg (1 mg Intravenous Given 09/04/16 2017)  albuterol (PROVENTIL,VENTOLIN) solution continuous neb (10 mg/hr Nebulization Given 09/04/16 2004)     Initial Impression / Assessment and Plan / ED Course  I have reviewed the triage vital signs and the nursing notes.  Pertinent labs & imaging results that were available during my care of the patient were reviewed by me and considered in my medical decision making (see chart for details).     Dorthula MatasMalinda L Evetts is a 66 year old female coming in today with shortness of breath. Patient reports history of COPD and emphysema and hospitalizations for exacerbations of his chronic conditions. Reported acute onset of shortness of breath earlier today that was not responsive to albuterol. EMS arrived and administered additional albuterol as well as gave Solu-Cortef and placed him on nasal cannula.  On arrival patient to get neck with marked respiratory distress and abdominal retractions. Patient is DO NOT RESUSCITATE and denied wanting to use BiPAP machine. He was placed on continuous nebulizer with some improvement in his tachypnea. Chest x-ray showed COPD but no focal abnormalities including no pneumonia. VBG shows normal pH and normal PCO2. Bicarbonate slightly elevated at 30. Mild leukocytosis.  Patient will be admitted to medicine for further evaluation and management. He was stable while under my care in the emergency department.  Patient was seen with my attending, Dr. Particia NearingHaviland, who voiced agreement and oversaw the evaluation and treatment of this patient.    Dr[employer] illustratorvoice dictation software was used in the creation of this note. If there are any errors or inconsistencies needing clarification, please contact me directly.   Final Clinical Impressions(s) / ED Diagnoses   Final diagnoses:  None  COPD exacerbation  New Prescriptions Current Discharge Medication List       Orson Slickolson, Ivet Guerrieri, MD 09/05/16 [id number]    Jacalyn LefevreHaviland, Julie, MD 09/05/16 2040

## 2016-09-05 ENCOUNTER — Encounter (HOSPITAL_COMMUNITY): Payer: Self-pay

## 2016-09-05 DIAGNOSIS — N182 Chronic kidney disease, stage 2 (mild): Secondary | ICD-10-CM

## 2016-09-05 DIAGNOSIS — J9601 Acute respiratory failure with hypoxia: Secondary | ICD-10-CM

## 2016-09-05 DIAGNOSIS — J441 Chronic obstructive pulmonary disease with (acute) exacerbation: Principal | ICD-10-CM

## 2016-09-05 LAB — BASIC METABOLIC PANEL
Anion gap: 10 (ref 5–15)
BUN: 14 mg/dL (ref 6–20)
CALCIUM: 8.8 mg/dL — AB (ref 8.9–10.3)
CO2: 23 mmol/L (ref 22–32)
CREATININE: 1.18 mg/dL — AB (ref 0.44–1.00)
Chloride: 104 mmol/L (ref 101–111)
GFR calc Af Amer: 55 mL/min — ABNORMAL LOW (ref >60)
GFR, EST NON AFRICAN AMERICAN: 47 mL/min — AB (ref >60)
GLUCOSE: 165 mg/dL — AB (ref 65–99)
Potassium: 3.8 mmol/L (ref 3.5–5.1)
Sodium: 137 mmol/L (ref 135–145)

## 2016-09-05 LAB — CBC WITH DIFFERENTIAL/PLATELET
BASOS ABS: 0 10*3/uL (ref 0.0–0.1)
BASOS PCT: 0 %
EOS PCT: 0 %
Eosinophils Absolute: 0 10*3/uL (ref 0.0–0.7)
HCT: 39.8 % (ref 36.0–46.0)
Hemoglobin: 13.4 g/dL (ref 12.0–15.0)
LYMPHS PCT: 5 %
Lymphs Abs: 0.5 10*3/uL — ABNORMAL LOW (ref 0.7–4.0)
MCH: 30.6 pg (ref 26.0–34.0)
MCHC: 33.7 g/dL (ref 30.0–36.0)
MCV: 90.9 fL (ref 78.0–100.0)
Monocytes Absolute: 0.1 10*3/uL (ref 0.1–1.0)
Monocytes Relative: 1 %
Neutro Abs: 9.5 10*3/uL — ABNORMAL HIGH (ref 1.7–7.7)
Neutrophils Relative %: 94 %
Platelets: 256 10*3/uL (ref 150–400)
RBC: 4.38 MIL/uL (ref 3.87–5.11)
RDW: 14.1 % (ref 11.5–15.5)
WBC: 10.2 10*3/uL (ref 4.0–10.5)

## 2016-09-05 LAB — GLUCOSE, CAPILLARY: GLUCOSE-CAPILLARY: 128 mg/dL — AB (ref 65–99)

## 2016-09-05 LAB — RAPID URINE DRUG SCREEN, HOSP PERFORMED
Amphetamines: NOT DETECTED
BARBITURATES: NOT DETECTED
Benzodiazepines: NOT DETECTED
Cocaine: POSITIVE — AB
Opiates: POSITIVE — AB
Tetrahydrocannabinol: NOT DETECTED

## 2016-09-05 LAB — HIV ANTIBODY (ROUTINE TESTING W REFLEX): HIV SCREEN 4TH GENERATION: NONREACTIVE

## 2016-09-05 LAB — ETHANOL

## 2016-09-05 MED ORDER — PNEUMOCOCCAL VAC POLYVALENT 25 MCG/0.5ML IJ INJ
0.5000 mL | INJECTION | INTRAMUSCULAR | Status: AC
  Administered 2016-09-06: 0.5 mL via INTRAMUSCULAR
  Filled 2016-09-05: qty 0.5

## 2016-09-05 NOTE — Progress Notes (Signed)
Pt will not let this RN do assessment on her, verbalized that previous RN already did checked her and there's no changes, explained to patient that shift assessment is required but patient still refused.

## 2016-09-05 NOTE — Progress Notes (Signed)
PT Cancellation Note  Patient Details Name: Jessica Townsend MRN: [id number] DOB: [birth year]   Cancelled Treatment:    Reason Eval/Treat Not Completed: PT screened, no needs identified, will sign off. Per pt and RN, pt is independent and only is limited by his breathing difficulties. PT will sign off. Please re-order if any new needs arise.    Jessica Townsend PT, DPT  [phone]  09/05/2016, 12:22 PM

## 2016-09-05 NOTE — Progress Notes (Signed)
PROGRESS NOTE    Jessica Townsend  ZOX:[id number] DOB: 20-Mar-1950 DOA: 09/04/2016 PCP: [name], MD    Brief Narrative: Jessica Townsend is a 66 y.o. female with medical history significant for COPD and chronic kidney disease stage II, now presenting to the emergency department for evaluation of dyspnea with productive cough. Admitted for evaluation and management of acute copd exacerbation.   Assessment & Plan:   Principal Problem:   COPD with acute exacerbation (HCC) Active Problems:   Acute respiratory failure with hypoxia (HCC)   CKD (chronic kidney disease), stage II   Acute respiratory failure with hypoxia secondary to COPD with acute exacerbation:  - admitted for IV steroids, [place] oxygen as needed to keep sats greater than 90%.  Bronchodilators, zithromax and Dulera BID.  As per RN, pt refused oxygen, non complaint to pulse oximetry, and blood draws.    Stage 2 CKD;  Creatinine at baseline.   Leukocytosis resolved.        DVT prophylaxis: scd's  Code Status: DNR Family Communication: none at bedside.  Disposition Plan: pending further evaluation.    Consultants:  None.    Procedures: None.   Antimicrobials: zithromax.    Subjective: Breathing is the same as per the patient,  Cough is better, no nausea or vomiting.  No headache or dizziness.   Objective: Vitals:   09/04/16 2215 09/04/16 2337 09/05/16 0010 09/05/16 0451  BP: 127/77 119/62  136/60  Pulse: (!) 119 (!) 122  (!) 103  Resp: 16 (!) 22  20  Temp:  98.2 F (36.8 C)  98.6 F (37 C)  TempSrc:  Oral  Oral  SpO2: 94% 95% 96% 90%  Weight:  65.7 kg (144 lb 12.8 oz)  66.2 kg (145 lb 14.4 oz)  Height:  6\' 1"  (1.854 m)      Intake/Output Summary (Last 24 hours) at 09/05/16 0840 Last data filed at 09/05/16 0630  Gross per 24 hour  Intake           863.33 ml  Output              275 ml  Net           588.33 ml   Filed Weights   09/04/16 2337 09/05/16 0451  Weight: 65.7 kg  (144 lb 12.8 oz) 66.2 kg (145 lb 14.4 oz)    Examination:  General exam:appears in mild distress from sob. Not on oxygen at this time.  Respiratory system: bilateral wheezing, coarse breath sounds. Air entry fair.  Cardiovascular system: S1 & S2 heard, RRR. No JVD, murmurs, rubs, gallops or clicks. No pedal edema. Gastrointestinal system: Abdomen is nondistended, soft and nontender. No organomegaly or masses felt. Normal bowel sounds heard. Central nervous system: Alert and oriented. No focal neurological deficits. Extremities: Symmetric 5 x 5 power. Skin: No rashes, lesions or ulcers Psychiatry: very anxious and restless,     Data Reviewed: I have personally reviewed following labs and imaging studies  CBC:  Recent Labs Lab 09/04/16 2001 09/05/16 0214  WBC 12.3* 10.2  NEUTROABS 9.1* 9.5*  HGB 14.4 13.4  HCT 41.9 39.8  MCV 91.1 90.9  PLT 289 256   Basic Metabolic Panel:  Recent Labs Lab 09/04/16 2001 09/05/16 0214  NA 138 137  K 4.2 3.8  CL 102 104  CO2 24 23  GLUCOSE 122* 165*  BUN 8 14  CREATININE 1.19* 1.18*  CALCIUM 8.9 8.8*   GFR: Estimated Creatinine Clearance (by C-G formula  based on SCr of 1.18 mg/dL (H)) Female: 16.149.7 mL/min (A) Female: 58.4 mL/min (A) Liver Function Tests: No results for input(s): AST, ALT, ALKPHOS, BILITOT, PROT, ALBUMIN in the last 168 hours. No results for input(s): LIPASE, AMYLASE in the last 168 hours. No results for input(s): AMMONIA in the last 168 hours. Coagulation Profile: No results for input(s): INR, PROTIME in the last 168 hours. Cardiac Enzymes: No results for input(s): CKTOTAL, CKMB, CKMBINDEX, TROPONINI in the last 168 hours. BNP (last 3 results) No results for input(s): PROBNP in the last 8760 hours. HbA1C: No results for input(s): HGBA1C in the last 72 hours. CBG:  Recent Labs Lab 09/05/16 0750  GLUCAP 128*   Lipid Profile: No results for input(s): CHOL, HDL, LDLCALC, TRIG, CHOLHDL, LDLDIRECT in the last  72 hours. Thyroid Function Tests: No results for input(s): TSH, T4TOTAL, FREET4, T3FREE, THYROIDAB in the last 72 hours. Anemia Panel: No results for input(s): VITAMINB12, FOLATE, FERRITIN, TIBC, IRON, RETICCTPCT in the last 72 hours. Sepsis Labs:  Recent Labs Lab 09/04/16 2001  PROCALCITON <0.10    No results found for this or any previous visit (from the past 240 hour(s)).       Radiology Studies: Dg Chest Portable 1 View  Result Date: 09/04/2016 CLINICAL DATA:  66 y/o  F; shortness of breath. EXAM: PORTABLE CHEST 1 VIEW COMPARISON:  11/06/2014 chest radiograph FINDINGS: Stable normal cardiac silhouette. Aortic atherosclerosis with calcification. Probable nipple shadow projecting over right sixth posterolateral rib. Hyperinflated lungs and flattened diaphragms compatible with COPD. No focal consolidation. No pleural effusion or pneumothorax. Bones are unremarkable. IMPRESSION: COPD.  No acute pulmonary process identified. Electronically Signed   By: Mitzi HansenLance  Furusawa-Stratton M.D.   On: 09/04/2016 20:22        Scheduled Meds: . methylPREDNISolone (SOLU-MEDROL) injection  60 mg Intravenous Q6H  . mometasone-formoterol  2 puff Inhalation BID  . [START ON 09/06/2016] pneumococcal 23 valent vaccine  0.5 mL Intramuscular Tomorrow-1000  . sodium chloride flush  3 mL Intravenous Q12H   Continuous Infusions: . azithromycin Stopped (09/04/16 2334)     LOS: 1 day    Time spent: 35 minutes.     Kathlen ModyAKULA,Anay Walter, MD [facility] Pager [phone]   If 7PM-7AM, please contact night-coverage [email or web address] Password [email or web address] 09/05/2016, 8:40 AM

## 2016-09-05 NOTE — Progress Notes (Signed)
Patient having reaction with the spices in the chicken at   [facility]. O2 at 95% Room air. Patient having shortness of breath using accesory muscles to breath but able to speak in full sentences. Duoneb given with effectivenss noted. Denies any pain. Ambulatory to the hallway. Will continue to monitor.

## 2016-09-05 NOTE — Progress Notes (Signed)
Patient nom-compliant with most of care. Refused to take his clothes off therefore Melva and I could not full perform skin assessment. Refused continuous pulse ox. Finally urinated this AM after several trials. Received resp. tx , ABX , Vicodin and seems to be doing brtter towards end of shift. On coming nurse caution to moniter him closely for respiratory compromise.

## 2016-09-05 NOTE — Progress Notes (Signed)
Patient requesting to go downstairs to smoke. Educate the patient regarding nicotine patch but patient refused. Patient anxious to go home. Report given to next shift nurse.

## 2016-09-06 LAB — BASIC METABOLIC PANEL
ANION GAP: 9 (ref 5–15)
BUN: 25 mg/dL — ABNORMAL HIGH (ref 6–20)
CALCIUM: 8.5 mg/dL — AB (ref 8.9–10.3)
CO2: 20 mmol/L — ABNORMAL LOW (ref 22–32)
Chloride: 108 mmol/L (ref 101–111)
Creatinine, Ser: 1.02 mg/dL — ABNORMAL HIGH (ref 0.44–1.00)
GFR, EST NON AFRICAN AMERICAN: 56 mL/min — AB (ref >60)
GLUCOSE: 130 mg/dL — AB (ref 65–99)
POTASSIUM: 4.8 mmol/L (ref 3.5–5.1)
SODIUM: 137 mmol/L (ref 135–145)

## 2016-09-06 LAB — GLUCOSE, CAPILLARY: GLUCOSE-CAPILLARY: 125 mg/dL — AB (ref 65–99)

## 2016-09-06 LAB — PROCALCITONIN

## 2016-09-06 MED ORDER — IPRATROPIUM-ALBUTEROL 0.5-2.5 (3) MG/3ML IN SOLN: 3.0000 mL | RESPIRATORY_TRACT | 1 refills | Status: DC | PRN

## 2016-09-06 MED ORDER — PREDNISONE 10 MG (21) PO TBPK: ORAL_TABLET | ORAL | 0 refills | Status: DC

## 2016-09-06 MED ORDER — TIOTROPIUM BROMIDE MONOHYDRATE 1.25 MCG/ACT IN AERS: 1.0000 "application " | INHALATION_SPRAY | Freq: Every day | RESPIRATORY_TRACT | 11 refills | Status: DC

## 2016-09-06 MED ORDER — IPRATROPIUM-ALBUTEROL 0.5-2.5 (3) MG/3ML IN SOLN: 3.0000 mL | Freq: Four times a day (QID) | RESPIRATORY_TRACT | 1 refills | Status: DC | PRN

## 2016-09-06 MED ORDER — AZITHROMYCIN 500 MG PO TABS: 500.0000 mg | ORAL_TABLET | Freq: Every day | ORAL | 0 refills | Status: AC

## 2016-09-07 ENCOUNTER — Telehealth: Payer: Self-pay | Admitting: Family Medicine

## 2016-09-07 ENCOUNTER — Other Ambulatory Visit: Payer: Self-pay

## 2016-09-07 MED ORDER — ALBUTEROL SULFATE (2.5 MG/3ML) 0.083% IN NEBU: INHALATION_SOLUTION | RESPIRATORY_TRACT | 5 refills | Status: DC

## 2016-09-07 NOTE — Telephone Encounter (Signed)
[facility] Ipratrop[facility]-Albuterol 0.5-2.5mg  that was increased by the hospital. 4 boxes of this was sent in on the script and it cost $25 per box for a total $75. This is too expensive for the pt so she wanted to know if Dr Susann GivensLalonde wanted to change this to something else like what he was on or if he should only get 1 box for now. Dr Susann GivensLalonde advise pt to get 1 box for now to last until her upcoming appt. Pt was advised.

## 2016-09-07 NOTE — Telephone Encounter (Signed)
Pt was called concerning recent hospital stay. Pt states she is not any better and feels terrible. Current and discharge meds were reconciled. Pt does needs a refill.  Please send albuteral nebulizer solution to [facility].  The medication Duoneb that hospital placed her on had to be order and when I spoke to her had not been received. After hour protocol was discussed and pt verbalized understanding. Pt was informed to bring all meds with her to Appt. Appt made by the hospital was verified and confirmed.

## 2016-09-07 NOTE — Telephone Encounter (Signed)
Make sure she has the appropriate meds and if we need to give samples go ahead and give them

## 2016-09-07 NOTE — Telephone Encounter (Signed)
I have sent in albuterol neb to [facility]

## 2016-09-07 NOTE — Discharge Summary (Addendum)
Physician Discharge Summary  Jessica Townsend ZOX:[id number] DOB: 1951-01-12 DOA: 09/04/2016  PCP: Ronnald NianLalonde, [name], MD  Admit date: 09/04/2016 Discharge date: 7/9 /2018  Admitted From: Home.  Disposition:  Home.   Recommendations for Outpatient Follow-up:  1. Follow up with PCP in 1-2 weeks 2. Please obtain BMP/CBC in one week  Discharge Condition: stable.  CODE STATUS: full code.  Diet recommendation: Heart Healthy Brief/Interim Summary: Jessica L Carringtonis a 66 y.o.femalewith medical history significant for COPD and chronic kidney disease stage II, now presenting to the emergency department for evaluation of dyspnea with productive cough. Admitted for evaluation and management of acute copd exacerbation.   Discharge Diagnoses:  Principal Problem:   COPD with acute exacerbation (HCC) Active Problems:   Acute respiratory failure with hypoxia (HCC)   CKD (chronic kidney disease), stage II  Acute respiratory failure with hypoxia secondary to COPD with acute exacerbation:  - admitted for IV steroids, [place] oxygen as needed to keep sats greater than 90%.  Bronchodilators, zithromax and Dulera BID.  As per RN, pt refused oxygen, non complaint to pulse oximetry, and blood draws.  His wheezing has improved and his breathing is better. Discharged him on steroid taper, duonebs and antibiotics to complete the course.     Stage 2 CKD;  Creatinine at baseline.   Leukocytosis resolved.    Discharge Instructions  Discharge Instructions    Diet - low sodium heart healthy    Complete by:  As directed    Diet general    Complete by:  As directed    Discharge instructions    Complete by:  As directed    Please follow up with PCP in 1 to 2 weeks.     Allergies as of 09/06/2016      Reactions   Antihistamines, Diphenhydramine-type Anaphylaxis, Shortness Of Breath   CANNOT TOLERATE ANY!!   Beef-derived Products Anaphylaxis   Chicken Protein Anaphylaxis   Monosodium Glutamate  Anaphylaxis   Other Anaphylaxis, Shortness Of Breath   CANNED FRUITS, MOST SPICES, CHICKEN AND BEEF GREASE AND FAT, VEGETABLE OIL, PORK FAT    Peanuts [peanut Oil] Anaphylaxis   "I DIE"   Pork-derived Products Anaphylaxis   Sulfate Anaphylaxis   Latex Rash   Tape Rash   Band-aids are the worst       Medication List    STOP taking these medications   amoxicillin-clavulanate 875-125 MG tablet Commonly known as:  AUGMENTIN     TAKE these medications   albuterol (2.5 MG/3ML) 0.083% nebulizer solution Commonly known as:  PROVENTIL INHALE 1 VIAL IN NEBULIZATION EVERY 6 HOURS AS NEEDED FOR WHEEZING OR SHORTNESS OF BREATH   azithromycin 500 MG tablet Commonly known as:  ZITHROMAX Take 1 tablet (500 mg total) by mouth daily. Take 1 tablet daily for 3 days.   budesonide-formoterol 160-4.5 MCG/ACT inhaler Commonly known as:  SYMBICORT Inhale 2 puffs into the lungs 2 (two) times daily.   DELSYM 30 MG/5ML liquid Generic drug:  dextromethorphan Take 15-30 mg by mouth every 6 (six) hours as needed for cough.   ipratropium-albuterol 0.5-2.5 (3) MG/3ML Soln Commonly known as:  DUONEB Take 3 mLs by nebulization every 6 (six) hours as needed.   predniSONE 10 MG (21) Tbpk tablet Commonly known as:  STERAPRED UNI-PAK 21 TAB Prednisone 60 mg daily for 3 days followed by  Prednisone 40 mg daily for 3 days followed by  Prednisone 20 mg daily for 3 days.   Tiotropium Bromide Monohydrate 1.25 MCG/ACT Aers  Commonly known as:  SPIRIVA RESPIMAT Inhale 1 application into the lungs daily.      Follow-up Information    [name], MD. Go on 09/16/2016.   Specialty:  Family Medicine Why:  @10 :30am Contact information: [address] [place] [address] [phone]          Allergies  Allergen Reactions  . Antihistamines, Diphenhydramine-Type Anaphylaxis and Shortness Of Breath    CANNOT TOLERATE ANY!!  . Beef-Derived Products Anaphylaxis  . Chicken Protein  Anaphylaxis  . Monosodium Glutamate Anaphylaxis  . Other Anaphylaxis and Shortness Of Breath    CANNED FRUITS, MOST SPICES, CHICKEN AND BEEF GREASE AND FAT, VEGETABLE OIL, PORK FAT   . Peanuts [Peanut Oil] Anaphylaxis    "I DIE"  . Pork-Derived Products Anaphylaxis  . Sulfate Anaphylaxis  . Latex Rash  . Tape Rash    Band-aids are the worst     Consultations:  None.    Procedures/Studies: Dg Chest Portable 1 View  Result Date: 09/04/2016 CLINICAL DATA:  66 y/o  F; shortness of breath. EXAM: PORTABLE CHEST 1 VIEW COMPARISON:  11/06/2014 chest radiograph FINDINGS: Stable normal cardiac silhouette. Aortic atherosclerosis with calcification. Probable nipple shadow projecting over right sixth posterolateral rib. Hyperinflated lungs and flattened diaphragms compatible with COPD. No focal consolidation. No pleural effusion or pneumothorax. Bones are unremarkable. IMPRESSION: COPD.  No acute pulmonary process identified. Electronically Signed   By: Mitzi Hansen M.D.   On: 09/04/2016 20:22       Subjective: No new complaints.   Discharge Exam: Vitals:   09/05/16 2139 09/06/16 0550  BP: (!) 153/90 122/68  Pulse: 91 84  Resp: 20 19  Temp: 98.2 F (36.8 C) 97.9 F (36.6 C)   Vitals:   09/05/16 1757 09/05/16 2139 09/06/16 0550 09/06/16 0758  BP:  (!) 153/90 122/68   Pulse: 99 91 84   Resp:  20 19   Temp:  98.2 F (36.8 C) 97.9 F (36.6 C)   TempSrc:  Oral Oral   SpO2: 95% 95% 93% 92%  Weight:      Height:        General: Pt is alert, awake, not in acute distress Cardiovascular: RRR, S1/S2 +, no rubs, no gallops Respiratory: CTA bilaterally, no wheezing, no rhonchi Abdominal: Soft, NT, ND, bowel sounds + Extremities: no edema, no cyanosis    The results of significant diagnostics from this hospitalization (including imaging, microbiology, ancillary and laboratory) are listed below for reference.     Microbiology: No results found for this or any previous  visit (from the past 240 hour(s)).   Labs: BNP (last 3 results) No results for input(s): BNP in the last 8760 hours. Basic Metabolic Panel:  Recent Labs Lab 09/04/16 2001 09/05/16 0214 09/06/16 0505  NA 138 137 137  K 4.2 3.8 4.8  CL 102 104 108  CO2 24 23 20*  GLUCOSE 122* 165* 130*  BUN 8 14 25*  CREATININE 1.19* 1.18* 1.02*  CALCIUM 8.9 8.8* 8.5*   Liver Function Tests: No results for input(s): AST, ALT, ALKPHOS, BILITOT, PROT, ALBUMIN in the last 168 hours. No results for input(s): LIPASE, AMYLASE in the last 168 hours. No results for input(s): AMMONIA in the last 168 hours. CBC:  Recent Labs Lab 09/04/16 2001 09/05/16 0214  WBC 12.3* 10.2  NEUTROABS 9.1* 9.5*  HGB 14.4 13.4  HCT 41.9 39.8  MCV 91.1 90.9  PLT 289 256   Cardiac Enzymes: No results for input(s): CKTOTAL, CKMB, CKMBINDEX,  TROPONINI in the last 168 hours. BNP: Invalid input(s): POCBNP CBG:  Recent Labs Lab 09/05/16 0750 09/06/16 0701  GLUCAP 128* 125*   D-Dimer No results for input(s): DDIMER in the last 72 hours. Hgb A1c No results for input(s): HGBA1C in the last 72 hours. Lipid Profile No results for input(s): CHOL, HDL, LDLCALC, TRIG, CHOLHDL, LDLDIRECT in the last 72 hours. Thyroid function studies No results for input(s): TSH, T4TOTAL, T3FREE, THYROIDAB in the last 72 hours.  Invalid input(s): FREET3 Anemia work up No results for input(s): VITAMINB12, FOLATE, FERRITIN, TIBC, IRON, RETICCTPCT in the last 72 hours. Urinalysis    Component Value Date/Time   COLORURINE YELLOW 11/06/2014 1510   APPEARANCEUR CLEAR 11/06/2014 1510   LABSPEC 1.019 11/06/2014 1510   PHURINE 5.0 11/06/2014 1510   GLUCOSEU NEGATIVE 11/06/2014 1510   HGBUR NEGATIVE 11/06/2014 1510   BILIRUBINUR NEGATIVE 11/06/2014 1510   KETONESUR 15 (A) 11/06/2014 1510   PROTEINUR NEGATIVE 11/06/2014 1510   UROBILINOGEN 0.2 11/06/2014 1510   NITRITE NEGATIVE 11/06/2014 1510   LEUKOCYTESUR NEGATIVE 11/06/2014 1510    Sepsis Labs Invalid input(s): PROCALCITONIN,  WBC,  LACTICIDVEN Microbiology No results found for this or any previous visit (from the past 240 hour(s)).   Time coordinating discharge: Over 30 minutes  SIGNED:   Kathlen Mody, MD  [facility] 09/07/2016, 9:31 AM Pager   If 7PM-7AM, please contact night-coverage [email or web address] Password [email or web address]

## 2016-09-16 ENCOUNTER — Ambulatory Visit (INDEPENDENT_AMBULATORY_CARE_PROVIDER_SITE_OTHER): Payer: Medicare Other | Admitting: Family Medicine

## 2016-09-16 ENCOUNTER — Encounter: Payer: Self-pay | Admitting: Family Medicine

## 2016-09-16 VITALS — BP 150/100 | HR 90 | Ht 73.0 in | Wt 151.0 lb

## 2016-09-16 DIAGNOSIS — J449 Chronic obstructive pulmonary disease, unspecified: Secondary | ICD-10-CM | POA: Diagnosis not present

## 2016-09-16 DIAGNOSIS — N182 Chronic kidney disease, stage 2 (mild): Secondary | ICD-10-CM

## 2016-09-16 DIAGNOSIS — J441 Chronic obstructive pulmonary disease with (acute) exacerbation: Secondary | ICD-10-CM | POA: Diagnosis not present

## 2016-09-16 DIAGNOSIS — E878 Other disorders of electrolyte and fluid balance, not elsewhere classified: Secondary | ICD-10-CM

## 2016-09-16 DIAGNOSIS — J4540 Moderate persistent asthma, uncomplicated: Secondary | ICD-10-CM | POA: Diagnosis not present

## 2016-09-16 DIAGNOSIS — J9601 Acute respiratory failure with hypoxia: Secondary | ICD-10-CM | POA: Diagnosis not present

## 2016-09-16 NOTE — Progress Notes (Signed)
   Subjective:    Patient ID: Jessica Townsend, female    DOB: [birth year], 66 y.o.   MRN: [id number]  HPI She is here for recheck after recent hospitalization and treatment for COPD with an acute exacerbation causing hypoxia. She was treated with steroids and an antibiotic and continued on her other inhaled medications. During hospitalization some of her renal function tests were slightly abnormal. Presently she is having no fever, chills, shortness of breath. She continues on her Symbicort, when necessary albuterol. She did finish the steroids and azithromycin. She apparently also had her apartment cleaned of any mold and mildew.   Review of Systems     Objective:   Physical Exam Alert and in no distress. Cardiac exam shows regular rhythm without murmurs or gallops. Lungs are clear to auscultation.       Assessment & Plan:  Disorder of electrolytes - Plan: Comprehensive metabolic panel  Chronic obstructive pulmonary disease, unspecified COPD type (HCC)  Moderate persistent asthma without complication  Acute respiratory failure with hypoxia (HCC)  CKD (chronic kidney disease), stage II  COPD with acute exacerbation (HCC) At this point she is doing quite well. Will continue on her present medications and check the blood work to make sure she has returned her normal state.

## 2016-09-17 LAB — COMPREHENSIVE METABOLIC PANEL
ALBUMIN: 3.4 g/dL — AB (ref 3.6–5.1)
ALT: 25 U/L (ref 6–29)
AST: 18 U/L (ref 10–35)
Alkaline Phosphatase: 68 U/L (ref 33–130)
BILIRUBIN TOTAL: 0.5 mg/dL (ref 0.2–1.2)
BUN: 14 mg/dL (ref 7–25)
CO2: 21 mmol/L (ref 20–31)
CREATININE: 0.98 mg/dL (ref 0.50–0.99)
Calcium: 8.8 mg/dL (ref 8.6–10.4)
Chloride: 106 mmol/L (ref 98–110)
Glucose, Bld: 92 mg/dL (ref 65–99)
Potassium: 4.2 mmol/L (ref 3.5–5.3)
SODIUM: 141 mmol/L (ref 135–146)
TOTAL PROTEIN: 6 g/dL — AB (ref 6.1–8.1)

## 2016-09-21 ENCOUNTER — Telehealth: Payer: Self-pay | Admitting: Family Medicine

## 2016-09-21 NOTE — Telephone Encounter (Signed)
Let her know that I'm glad she is getting better

## 2016-09-21 NOTE — Telephone Encounter (Signed)
Pt called and left a VM  stating that her pulse is 91 and O2 is 98 she states that you wanted to know, states that it is getting better she can be reached at [phone]

## 2016-09-21 NOTE — Telephone Encounter (Signed)
Tried to call pt but phone will ring once then go to a silent tone. Will try later

## 2017-02-20 ENCOUNTER — Other Ambulatory Visit: Payer: Self-pay | Admitting: Family Medicine

## 2017-02-20 DIAGNOSIS — J4530 Mild persistent asthma, uncomplicated: Secondary | ICD-10-CM

## 2017-03-12 ENCOUNTER — Encounter (HOSPITAL_COMMUNITY): Payer: Self-pay | Admitting: Emergency Medicine

## 2017-03-12 ENCOUNTER — Emergency Department (HOSPITAL_COMMUNITY): Payer: Medicare Other

## 2017-03-12 ENCOUNTER — Inpatient Hospital Stay (HOSPITAL_COMMUNITY)
Admission: EM | Admit: 2017-03-12 | Discharge: 2017-03-15 | DRG: 192 | Disposition: A | Discharge status: Home / Self Care | Payer: Medicare Other | Attending: Internal Medicine | Admitting: Internal Medicine

## 2017-03-12 ENCOUNTER — Emergency Department (HOSPITAL_COMMUNITY)
Admission: EM | Admit: 2017-03-12 | Discharge: 2017-03-12 | Disposition: A | Discharge status: Home / Self Care | Payer: Medicare Other | Source: Home / Self Care | Attending: Emergency Medicine | Admitting: Emergency Medicine

## 2017-03-12 DIAGNOSIS — Z91010 Allergy to peanuts: Secondary | ICD-10-CM | POA: Diagnosis not present

## 2017-03-12 DIAGNOSIS — Z91018 Allergy to other foods: Secondary | ICD-10-CM

## 2017-03-12 DIAGNOSIS — J449 Chronic obstructive pulmonary disease, unspecified: Secondary | ICD-10-CM | POA: Insufficient documentation

## 2017-03-12 DIAGNOSIS — F640 Transsexualism: Secondary | ICD-10-CM | POA: Diagnosis not present

## 2017-03-12 DIAGNOSIS — Z809 Family history of malignant neoplasm, unspecified: Secondary | ICD-10-CM

## 2017-03-12 DIAGNOSIS — Z91048 Other nonmedicinal substance allergy status: Secondary | ICD-10-CM

## 2017-03-12 DIAGNOSIS — Z825 Family history of asthma and other chronic lower respiratory diseases: Secondary | ICD-10-CM

## 2017-03-12 DIAGNOSIS — Z7951 Long term (current) use of inhaled steroids: Secondary | ICD-10-CM

## 2017-03-12 DIAGNOSIS — R0682 Tachypnea, not elsewhere classified: Secondary | ICD-10-CM | POA: Diagnosis not present

## 2017-03-12 DIAGNOSIS — Z79899 Other long term (current) drug therapy: Secondary | ICD-10-CM

## 2017-03-12 DIAGNOSIS — Z87891 Personal history of nicotine dependence: Secondary | ICD-10-CM | POA: Insufficient documentation

## 2017-03-12 DIAGNOSIS — Z833 Family history of diabetes mellitus: Secondary | ICD-10-CM

## 2017-03-12 DIAGNOSIS — Z789 Other specified health status: Secondary | ICD-10-CM | POA: Diagnosis present

## 2017-03-12 DIAGNOSIS — Z823 Family history of stroke: Secondary | ICD-10-CM

## 2017-03-12 DIAGNOSIS — R0902 Hypoxemia: Secondary | ICD-10-CM | POA: Diagnosis not present

## 2017-03-12 DIAGNOSIS — J441 Chronic obstructive pulmonary disease with (acute) exacerbation: Secondary | ICD-10-CM | POA: Diagnosis not present

## 2017-03-12 DIAGNOSIS — Z8249 Family history of ischemic heart disease and other diseases of the circulatory system: Secondary | ICD-10-CM

## 2017-03-12 DIAGNOSIS — Z888 Allergy status to other drugs, medicaments and biological substances status: Secondary | ICD-10-CM

## 2017-03-12 DIAGNOSIS — J439 Emphysema, unspecified: Secondary | ICD-10-CM | POA: Diagnosis not present

## 2017-03-12 DIAGNOSIS — Z91040 Latex allergy status: Secondary | ICD-10-CM | POA: Insufficient documentation

## 2017-03-12 DIAGNOSIS — N182 Chronic kidney disease, stage 2 (mild): Secondary | ICD-10-CM | POA: Diagnosis present

## 2017-03-12 DIAGNOSIS — R069 Unspecified abnormalities of breathing: Secondary | ICD-10-CM | POA: Diagnosis not present

## 2017-03-12 DIAGNOSIS — F39 Unspecified mood [affective] disorder: Secondary | ICD-10-CM | POA: Diagnosis present

## 2017-03-12 DIAGNOSIS — F17210 Nicotine dependence, cigarettes, uncomplicated: Secondary | ICD-10-CM | POA: Diagnosis present

## 2017-03-12 DIAGNOSIS — R0603 Acute respiratory distress: Secondary | ICD-10-CM | POA: Diagnosis not present

## 2017-03-12 LAB — BASIC METABOLIC PANEL
Anion gap: 12 (ref 5–15)
BUN: 14 mg/dL (ref 6–20)
CO2: 20 mmol/L — ABNORMAL LOW (ref 22–32)
Calcium: 8.9 mg/dL (ref 8.9–10.3)
Chloride: 104 mmol/L (ref 101–111)
Creatinine, Ser: 1.44 mg/dL — ABNORMAL HIGH (ref 0.44–1.00)
GFR calc Af Amer: 43 mL/min — ABNORMAL LOW (ref >60)
GFR calc non Af Amer: 37 mL/min — ABNORMAL LOW (ref >60)
Glucose, Bld: 105 mg/dL — ABNORMAL HIGH (ref 65–99)
Potassium: 3.7 mmol/L (ref 3.5–5.1)
Sodium: 136 mmol/L (ref 135–145)

## 2017-03-12 LAB — I-STAT ARTERIAL BLOOD GAS, ED
Acid-base deficit: 5 mmol/L — ABNORMAL HIGH (ref 0.0–2.0)
Bicarbonate: 19.5 mmol/L — ABNORMAL LOW (ref 20.0–28.0)
O2 Saturation: 98 %
Patient temperature: 98.6
TCO2: 21 mmol/L — ABNORMAL LOW (ref 22–32)
pCO2 arterial: 34.7 mmHg (ref 32.0–48.0)
pH, Arterial: 7.357 (ref 7.350–7.450)
pO2, Arterial: 114 mmHg — ABNORMAL HIGH (ref 83.0–108.0)

## 2017-03-12 LAB — CBC WITH DIFFERENTIAL/PLATELET
Basophils Absolute: 0.1 10*3/uL (ref 0.0–0.1)
Basophils Relative: 2 %
Eosinophils Absolute: 0.1 10*3/uL (ref 0.0–0.7)
Eosinophils Relative: 1 %
HCT: 40.4 % (ref 36.0–46.0)
Hemoglobin: 13.7 g/dL (ref 12.0–15.0)
Lymphocytes Relative: 28 %
Lymphs Abs: 1.9 10*3/uL (ref 0.7–4.0)
MCH: 30.9 pg (ref 26.0–34.0)
MCHC: 33.9 g/dL (ref 30.0–36.0)
MCV: 91 fL (ref 78.0–100.0)
Monocytes Absolute: 0.9 10*3/uL (ref 0.1–1.0)
Monocytes Relative: 14 %
Neutro Abs: 3.8 10*3/uL (ref 1.7–7.7)
Neutrophils Relative %: 55 %
Platelets: 283 10*3/uL (ref 150–400)
RBC: 4.44 MIL/uL (ref 3.87–5.11)
RDW: 14.4 % (ref 11.5–15.5)
WBC: 6.8 10*3/uL (ref 4.0–10.5)

## 2017-03-12 MED ORDER — IPRATROPIUM BROMIDE 0.02 % IN SOLN
0.5000 mg | Freq: Once | RESPIRATORY_TRACT | Status: AC
  Administered 2017-03-12: 0.5 mg via RESPIRATORY_TRACT
  Filled 2017-03-12: qty 2.5

## 2017-03-12 MED ORDER — ALBUTEROL (5 MG/ML) CONTINUOUS INHALATION SOLN
15.0000 mg/h | INHALATION_SOLUTION | RESPIRATORY_TRACT | Status: DC
  Administered 2017-03-12: 15 mg/h via RESPIRATORY_TRACT
  Filled 2017-03-12: qty 20

## 2017-03-12 MED ORDER — LORAZEPAM 2 MG/ML IJ SOLN
0.5000 mg | Freq: Once | INTRAMUSCULAR | Status: AC
  Administered 2017-03-12: 0.5 mg via INTRAVENOUS
  Filled 2017-03-12: qty 1

## 2017-03-12 MED ORDER — PREDNISONE 20 MG PO TABS: 40.0000 mg | ORAL_TABLET | Freq: Every day | ORAL | 0 refills | Status: DC

## 2017-03-12 NOTE — ED Triage Notes (Signed)
Pt from home c/o SOB at 2145 started having trouble breathing inhaler ineffective. [id number] today at [facility]00 for same complaint RLL wheezing currently but prior to Tx  albuteral 5mg  and 0.5 Atrovent  125 Solumedrol 18 ga angio right AC EKG Afib rate 116 no ST changes noted Hx of afib during asthma attacks that are brought on my stress. [facility]

## 2017-03-12 NOTE — ED Provider Notes (Signed)
[facility] EMERGENCY DEPARTMENT Provider Note   CSN: [id number] Arrival date & time: 03/12/17  1053     History   Chief Complaint Chief Complaint  Patient presents with  . Respiratory Distress    HPI NAKITA SANTERRE is a 67 y.o. adult.  HPI   67 year old female to female with respiratory distress.  Symptoms worsening since Thursday.  She feels like she cannot breathe.  Chest pain/tightness.  EMS administered 125 mg Solu-Medrol, 10 mg albuterol, 0.5 mg of Atrovent prior to arrival.  They wanted to place her on CPAP but she refused.  She is reluctant to give me much history and responds to most questions by telling me "it's all in the computer." She does deny fever. No unusual leg pain or swelling.  Past Medical History:  Diagnosis Date  . Asthma   . COPD (chronic obstructive pulmonary disease) (HCC)   . Mood disorder (HCC)   . Smoker   . Trans-sexualism     Patient Active Problem List   Diagnosis Date Noted  . Acute respiratory failure with hypoxia (HCC) 09/04/2016  . CKD (chronic kidney disease), stage II 09/04/2016  . COPD with acute exacerbation (HCC) 11/14/2014  . Asthma 05/31/2011  . Current occasional smoker 05/31/2011    No past surgical history on file.  OB History    No data available       Home Medications    Prior to Admission medications   Medication Sig Start Date End Date Taking? Authorizing Provider  albuterol (PROVENTIL) (2.5 MG/3ML) 0.083% nebulizer solution INHALE 1 VIAL IN NEBULIZATION EVERY 6 HOURS AS NEEDED FOR WHEEZING OR SHORTNESS OF BREATH 09/07/16   [name], MD  SYMBICORT 160-4.5 MCG/ACT inhaler INHALE 2 PUFFS INTO THE LUNGS 2 (TWO) TIMES DAILY. 02/21/17   [name], MD    Family History Family History  Problem Relation Age of Onset  . Hypertension Mother   . Hypertension Father   . Stroke Father   . Ulcers Father   . Heart attack Father   . Asthma Sister   . Diabetes Paternal Uncle   .  Cancer Maternal Grandmother   . Asthma Maternal Grandfather     Social History Social History   Tobacco Use  . Smoking status: Former Smoker    Packs/day: 0.20    Years: 20.00    Pack years: 4.00    Types: Cigarettes  . Smokeless tobacco: Never Used  Substance Use Topics  . Alcohol use: No    Alcohol/week: 0.0 oz  . Drug use: No     Allergies   Antihistamines, diphenhydramine-type; Beef-derived products; Chicken protein; Monosodium glutamate; Other; Peanuts [peanut oil]; Pork-derived products; Sulfate; Latex; and Tape   Review of Systems Review of Systems  All systems reviewed and negative, other than as noted in HPI.  Physical Exam Updated Vital Signs BP 125/84   Pulse (!) 131   Temp 98 F (36.7 C) (Tympanic)   Resp (!) 43   SpO2 96%   Physical Exam  Constitutional: She appears distressed.  Thin body habitus  HENT:  Head: Normocephalic and atraumatic.  Eyes: Conjunctivae are normal. Right eye exhibits no discharge. Left eye exhibits no discharge.  Neck: Neck supple.  Cardiovascular: Regular rhythm and normal heart sounds. Exam reveals no gallop and no friction rub.  No murmur heard. Tachycardic  Pulmonary/Chest: She is in respiratory distress.  Speaks in short phrases.  Tachypnea.  Accessory muscle usage.  Prolonged expiratory phase.  Bilateral  wheezing.  Abdominal: Soft. She exhibits no distension. There is no tenderness.  Musculoskeletal: She exhibits no edema or tenderness.  Neurological: She is alert.  Skin: Skin is warm and dry.  Psychiatric: She has a normal mood and affect. Her behavior is normal. Thought content normal.  Nursing note and vitals reviewed.    ED Treatments / Results  Labs (all labs ordered are listed, but only abnormal results are displayed) Labs Reviewed  BASIC METABOLIC PANEL - Abnormal; Notable for the following components:      Result Value   CO2 20 (*)    Glucose, Bld 105 (*)    Creatinine, Ser 1.44 (*)    GFR calc non  Af Amer 37 (*)    GFR calc Af Amer 43 (*)    All other components within normal limits  I-STAT ARTERIAL BLOOD GAS, ED - Abnormal; Notable for the following components:   pO2, Arterial 114.0 (*)    Bicarbonate 19.5 (*)    TCO2 21 (*)    Acid-base deficit 5.0 (*)    All other components within normal limits  CBC WITH DIFFERENTIAL/PLATELET    EKG  EKG Interpretation None       Radiology No results found.  Procedures Procedures (including critical care time)  Medications Ordered in ED Medications  albuterol (PROVENTIL,VENTOLIN) solution continuous neb (not administered)  ipratropium (ATROVENT) nebulizer solution 0.5 mg (not administered)     Initial Impression / Assessment and Plan / ED Course  I have reviewed the triage vital signs and the nursing notes.  Pertinent labs & imaging results that were available during my care of the patient were reviewed by me and considered in my medical decision making (see chart for details).     67 year old female with respiratory distress.  Clinically COPD exacerbation.  Improved after neb.  She was ambulated on room air and oxygen saturations stayed in the 90s.  She reports she has adequate medication and does not need additional refills.  Will provide a prescription for prednisone.  Return precautions discussed.  Final Clinical Impressions(s) / ED Diagnoses   Final diagnoses:  COPD exacerbation [facility](HCC)    ED Discharge Orders    None       Raeford RazorKohut, Misheel Gowans, MD 03/12/17 1528

## 2017-03-12 NOTE — ED Notes (Signed)
Pt stable, ambulatory,, states understanding of discharge instructions 

## 2017-03-12 NOTE — ED Provider Notes (Signed)
TIME SEEN: 11:56 PM  CHIEF COMPLAINT: Shortness of breath, productive cough   HPI: Patient is a 67 year old female transitioning to female with history of COPD who is not on home oxygen but continues to smoke who presents to the emergency department shortness of breath, wheezing.  Seen in the [facility] department earlier today for the same.  Improved with breathing treatments but states symptoms worsened after a couple of hours at home.  Does not wear oxygen chronically and has an oxygen saturation here of 88% on room air.  Reports cough with brown sputum production but no fever.  No history of CHF.  No lower extremity swelling or pain.  No chest pain.  ROS: See HPI Constitutional: no fever  Eyes: no drainage  ENT: no runny nose   Cardiovascular:  no chest pain  Resp: SOB  GI: no vomiting GU: no dysuria Integumentary: no rash  Allergy: no hives  Musculoskeletal: no leg swelling  Neurological: no slurred speech ROS otherwise negative  PAST MEDICAL HISTORY/PAST SURGICAL HISTORY:  Past Medical History:  Diagnosis Date  . Asthma   . COPD (chronic obstructive pulmonary disease) (HCC)   . Mood disorder (HCC)   . Smoker   . Trans-sexualism     MEDICATIONS:  Prior to Admission medications   Medication Sig Start Date End Date Taking? Authorizing Provider  albuterol (PROVENTIL) (2.5 MG/3ML) 0.083% nebulizer solution INHALE 1 VIAL IN NEBULIZATION EVERY 6 HOURS AS NEEDED FOR WHEEZING OR SHORTNESS OF BREATH Patient not taking: Reported on 03/12/2017 09/07/16   Ronnald NianLalonde, [name], MD  ipratropium-albuterol (DUONEB) 0.5-2.5 (3) MG/3ML SOLN Inhale 3 mLs into the lungs every 6 (six) hours as needed for shortness of breath. 02/14/17   [provider]  predniSONE (DELTASONE) 20 MG tablet Take 2 tablets (40 mg total) by mouth daily. 03/12/17   Raeford RazorKohut, Stephen, MD  SYMBICORT 160-4.5 MCG/ACT inhaler INHALE 2 PUFFS INTO THE LUNGS 2 (TWO) TIMES DAILY. 02/21/17   Ronnald NianLalonde, [name], MD     ALLERGIES:  Allergies  Allergen Reactions  . Antihistamines, Diphenhydramine-Type Anaphylaxis and Shortness Of Breath    CANNOT TOLERATE ANY!!  . Beef-Derived Products Anaphylaxis  . Chicken Protein Anaphylaxis  . Monosodium Glutamate Anaphylaxis  . Other Anaphylaxis and Shortness Of Breath    CANNED FRUITS, MOST SPICES, CHICKEN AND BEEF GREASE AND FAT, VEGETABLE OIL, PORK FAT   . Peanuts [Peanut Oil] Anaphylaxis    "I DIE"  . Pork-Derived Products Anaphylaxis  . Soap Anaphylaxis  . Sulfate Anaphylaxis  . Latex Rash  . Tape Rash    Band-aids are the worst     SOCIAL HISTORY:  Social History   Tobacco Use  . Smoking status: Former Smoker    Packs/day: 0.20    Years: 20.00    Pack years: 4.00    Types: Cigarettes  . Smokeless tobacco: Never Used  Substance Use Topics  . Alcohol use: No    Alcohol/week: 0.0 oz    FAMILY HISTORY: Family History  Problem Relation Age of Onset  . Hypertension Mother   . Hypertension Father   . Stroke Father   . Ulcers Father   . Heart attack Father   . Asthma Sister   . Diabetes Paternal Uncle   . Cancer Maternal Grandmother   . Asthma Maternal Grandfather     EXAM: BP (!) 143/79 (BP Location: Right Arm)   Pulse (!) 114   Temp 99.1 F (37.3 C) (Oral)   Resp (!) 26  SpO2 92%  CONSTITUTIONAL: Alert and oriented and responds appropriately to questions.  Thin, chronically ill-appearing HEAD: Normocephalic EYES: Conjunctivae clear, pupils appear equal, EOMI ENT: normal nose; moist mucous membranes NECK: Supple, no meningismus, no nuchal rigidity, no LAD  CARD: Regular and tachycardic; S1 and S2 appreciated; no murmurs, no clicks, no rubs, no gallops RESP: Normal chest excursion without splinting, patient is tachypneic, diffuse expiratory wheezes, diminished at bases bilaterally, no rhonchi or rales, hypoxic on room air, speaking short sentences, does appear to be working to breathe just at rest ABD/GI: Normal bowel sounds;  non-distended; soft, non-tender, no rebound, no guarding, no peritoneal signs, no hepatosplenomegaly BACK:  The back appears normal and is non-tender to palpation, there is no CVA tenderness EXT: Normal ROM in all joints; non-tender to palpation; no edema; normal capillary refill; no cyanosis, no calf tenderness or swelling    SKIN: Normal color for age and race; warm; no rash NEURO: Moves all extremities equally PSYCH: The patient's mood and manner are appropriate. Grooming and personal hygiene are appropriate.  MEDICAL DECISION MAKING: Patient here with COPD exacerbation.  Labs, ABG and chest x-ray obtained today at [facility] were unremarkable.  I do not feel this needs to be repeated given these were done less than 12 hours ago.  He did receive 125 mg of IV Solu-Medrol with EMS.  Will give continuous albuterol treatment with Atrovent as well and reassess.  ED PROGRESS: Patient has had some improvement with breathing treatment but still needs further treatments and likely admission.  Patient agrees with this plan.  Will discuss with hospitalist for admission for COPD exacerbation.   2:37 AM Discussed patient's case with hospitalist, Dr. Julian Reil.  I have recommended admission and patient (and family if present) agree with this plan. Admitting physician will place admission orders.   I reviewed all nursing notes, vitals, pertinent previous records, EKGs, lab and urine results, imaging (as available).     CRITICAL CARE Performed by: Rochele Raring   Total critical care time: 35 minutes  Critical care time was exclusive of separately billable procedures and treating other patients.  Critical care was necessary to treat or prevent imminent or life-threatening deterioration.  Critical care was time spent personally by me on the following activities: development of treatment plan with patient and/or surrogate as well as nursing, discussions with consultants, evaluation of patient's  response to treatment, examination of patient, obtaining history from patient or surrogate, ordering and performing treatments and interventions, ordering and review of laboratory studies, ordering and review of radiographic studies, pulse oximetry and re-evaluation of patient's condition.    Kadience Macchi, Layla Maw, DO 03/13/17 [phone]

## 2017-03-12 NOTE — ED Notes (Addendum)
Pt 92% while ambulating only c/o muscle soreness. Dr Juleen ChinaKohut notified

## 2017-03-12 NOTE — ED Triage Notes (Addendum)
Per EMS- Pt here for respiratory distress. Refused Cpap with EMS. Given 125 solumedrol, 10 albuterol, .5 atrovent. CBG 104. HR 130. Hx COPD

## 2017-03-12 NOTE — ED Notes (Signed)
Bed: WA06 Expected date:  Expected time:  Means of arrival:  Comments: EMS 67 yo female asthma wheezing duoneb

## 2017-03-13 ENCOUNTER — Other Ambulatory Visit: Payer: Self-pay

## 2017-03-13 ENCOUNTER — Encounter (HOSPITAL_COMMUNITY): Payer: Self-pay | Admitting: Family Medicine

## 2017-03-13 DIAGNOSIS — R0902 Hypoxemia: Secondary | ICD-10-CM

## 2017-03-13 DIAGNOSIS — F640 Transsexualism: Secondary | ICD-10-CM | POA: Diagnosis not present

## 2017-03-13 DIAGNOSIS — J441 Chronic obstructive pulmonary disease with (acute) exacerbation: Principal | ICD-10-CM

## 2017-03-13 DIAGNOSIS — Z789 Other specified health status: Secondary | ICD-10-CM | POA: Diagnosis present

## 2017-03-13 DIAGNOSIS — N182 Chronic kidney disease, stage 2 (mild): Secondary | ICD-10-CM | POA: Diagnosis not present

## 2017-03-13 MED ORDER — TRAZODONE HCL 50 MG PO TABS
50.0000 mg | ORAL_TABLET | Freq: Once | ORAL | Status: AC
  Administered 2017-03-13: 50 mg via ORAL
  Filled 2017-03-13 (×2): qty 1

## 2017-03-13 MED ORDER — IPRATROPIUM BROMIDE 0.02 % IN SOLN
0.5000 mg | Freq: Two times a day (BID) | RESPIRATORY_TRACT | Status: DC
  Administered 2017-03-13 – 2017-03-14 (×3): 0.5 mg via RESPIRATORY_TRACT
  Filled 2017-03-13 (×4): qty 2.5

## 2017-03-13 MED ORDER — SODIUM CHLORIDE 0.9 % IV SOLN
INTRAVENOUS | Status: DC
  Administered 2017-03-13: 13:00:00 via INTRAVENOUS
  Administered 2017-03-13: 125 mL/h via INTRAVENOUS
  Administered 2017-03-14: 04:00:00 via INTRAVENOUS

## 2017-03-13 MED ORDER — IPRATROPIUM BROMIDE 0.02 % IN SOLN
1.0000 mg | Freq: Once | RESPIRATORY_TRACT | Status: AC
  Administered 2017-03-13: 1 mg via RESPIRATORY_TRACT
  Filled 2017-03-13: qty 5

## 2017-03-13 MED ORDER — DEXTROSE 5 % IV SOLN
1.0000 g | INTRAVENOUS | Status: DC
  Administered 2017-03-13 – 2017-03-14 (×2): 1 g via INTRAVENOUS
  Filled 2017-03-13 (×2): qty 10

## 2017-03-13 MED ORDER — PREDNISONE 20 MG PO TABS
40.0000 mg | ORAL_TABLET | Freq: Every day | ORAL | Status: DC
  Administered 2017-03-13 – 2017-03-14 (×2): 40 mg via ORAL
  Filled 2017-03-13 (×2): qty 2

## 2017-03-13 MED ORDER — METHYLPREDNISOLONE SODIUM SUCC 125 MG IJ SOLR: 125.0000 mg | Freq: Once | INTRAMUSCULAR | Status: DC

## 2017-03-13 MED ORDER — ALBUTEROL SULFATE (2.5 MG/3ML) 0.083% IN NEBU
2.5000 mg | INHALATION_SOLUTION | RESPIRATORY_TRACT | Status: DC | PRN
  Administered 2017-03-14 (×2): 2.5 mg via RESPIRATORY_TRACT
  Filled 2017-03-13 (×2): qty 3

## 2017-03-13 MED ORDER — MOMETASONE FURO-FORMOTEROL FUM 200-5 MCG/ACT IN AERO
2.0000 | INHALATION_SPRAY | Freq: Two times a day (BID) | RESPIRATORY_TRACT | Status: DC
  Administered 2017-03-13 – 2017-03-14 (×3): 2 via RESPIRATORY_TRACT
  Filled 2017-03-13: qty 8.8

## 2017-03-13 MED ORDER — ALBUTEROL (5 MG/ML) CONTINUOUS INHALATION SOLN
15.0000 mg/h | INHALATION_SOLUTION | Freq: Once | RESPIRATORY_TRACT | Status: AC
  Administered 2017-03-13: 15 mg/h via RESPIRATORY_TRACT
  Filled 2017-03-13: qty 20

## 2017-03-13 MED ORDER — IPRATROPIUM BROMIDE 0.02 % IN SOLN
0.5000 mg | Freq: Four times a day (QID) | RESPIRATORY_TRACT | Status: DC
  Administered 2017-03-13 (×2): 0.5 mg via RESPIRATORY_TRACT
  Filled 2017-03-13 (×2): qty 2.5

## 2017-03-13 NOTE — ED Notes (Signed)
ED TO INPATIENT HANDOFF REPORT  Name/Age/Gender Jessica Townsend 67 y.o. adult  Code Status Code Status History    Date Active Date Inactive Code Status Order ID Comments User Context   09/04/2016 21:59 09/06/2016 12:57 DNR [id number]  Vianne Bulls, MD ED   11/06/2014 04:36 11/09/2014 16:18 Full Code [id number]  Jani Gravel, MD Inpatient    Questions for Most Recent Historical Code Status (Order [id number])    Question Answer Comment   In the event of cardiac or respiratory ARREST Do not call a "code blue"    In the event of cardiac or respiratory ARREST Do not perform Intubation, CPR, defibrillation or ACLS    In the event of cardiac or respiratory ARREST Use medication by any route, position, wound care, and other measures to relive pain and suffering. May use oxygen, suction and manual treatment of airway obstruction as needed for comfort.       Home/SNF/Other Home  Chief Complaint No admission diagnoses are documented for this encounter.  Level of Care/Admitting Diagnosis ED Disposition    ED Disposition Condition Comment   Admit  Hospital Area: [place] [[id number]]  Level of Care: Telemetry [5]  Admit to tele based on following criteria: Complex arrhythmia (Bradycardia/Tachycardia)  Diagnosis: COPD exacerbation [facility]) [338250]  Admitting Physician: Etta Quill [phone]  Attending Physician: Etta Quill [[id number]]  PT Class (Do Not Modify): Observation [104]  PT Acc Code (Do Not Modify): Observation [10022]       Medical History Past Medical History:  Diagnosis Date  . Asthma   . COPD (chronic obstructive pulmonary disease) ([place])   . Mood disorder ([place])   . Smoker   . Trans-sexualism     Allergies Allergies  Allergen Reactions  . Antihistamines, Diphenhydramine-Type Anaphylaxis and Shortness Of Breath    CANNOT TOLERATE ANY!!  . Beef-Derived Products Anaphylaxis  . Chicken Protein Anaphylaxis  . Monosodium Glutamate Anaphylaxis  .  Other Anaphylaxis and Shortness Of Breath    CANNED FRUITS, MOST SPICES, CHICKEN AND BEEF GREASE AND FAT, VEGETABLE OIL, PORK FAT   . Peanuts [Peanut Oil] Anaphylaxis    "I DIE"  . Pork-Derived Products Anaphylaxis  . Soap Anaphylaxis  . Sulfate Anaphylaxis  . Latex Rash  . Tape Rash    Band-aids are the worst     IV Location/Drains/Wounds Patient Lines/Drains/Airways Status   Active Line/Drains/Airways    Name:   Placement date:   Placement time:   Site:   Days:   Peripheral IV 03/12/17 Right Antecubital   03/12/17    2350    Antecubital   1          Labs/Imaging Results for orders placed or performed during the hospital encounter of 03/12/17 (from the past 48 hour(s))  CBC with Differential     Status: None   Collection Time: 03/12/17 11:07 AM  Result Value Ref Range   WBC 6.8 4.0 - 10.5 K/uL   RBC 4.44 3.87 - 5.11 MIL/uL   Hemoglobin 13.7 12.0 - 15.0 g/dL   HCT 40.4 36.0 - 46.0 %   MCV 91.0 78.0 - 100.0 fL   MCH 30.9 26.0 - 34.0 pg   MCHC 33.9 30.0 - 36.0 g/dL   RDW 14.4 11.5 - 15.5 %   Platelets 283 150 - 400 K/uL   Neutrophils Relative % 55 %   Neutro Abs 3.8 1.7 - 7.7 K/uL   Lymphocytes Relative 28 %   Lymphs Abs 1.9 0.7 -  4.0 K/uL   Monocytes Relative 14 %   Monocytes Absolute 0.9 0.1 - 1.0 K/uL   Eosinophils Relative 1 %   Eosinophils Absolute 0.1 0.0 - 0.7 K/uL   Basophils Relative 2 %   Basophils Absolute 0.1 0.0 - 0.1 K/uL  Basic metabolic panel     Status: Abnormal   Collection Time: 03/12/17 11:07 AM  Result Value Ref Range   Sodium 136 135 - 145 mmol/L   Potassium 3.7 3.5 - 5.1 mmol/L   Chloride 104 101 - 111 mmol/L   CO2 20 (L) 22 - 32 mmol/L   Glucose, Bld 105 (H) 65 - 99 mg/dL   BUN 14 6 - 20 mg/dL   Creatinine, Ser 1.44 (H) 0.44 - 1.00 mg/dL   Calcium 8.9 8.9 - 10.3 mg/dL   GFR calc non Af Amer 37 (L) >60 mL/min   GFR calc Af Amer 43 (L) >60 mL/min    Comment: (NOTE) The eGFR has been calculated using the CKD EPI equation. This  calculation has not been validated in all clinical situations. eGFR's persistently <60 mL/min signify possible Chronic Kidney Disease.    Anion gap 12 5 - 15  I-Stat Arterial Blood Gas, ED - (order at [facility] and [facility] only)     Status: Abnormal   Collection Time: 03/12/17 11:15 AM  Result Value Ref Range   pH, Arterial 7.357 7.350 - 7.450   pCO2 arterial 34.7 32.0 - 48.0 mmHg   pO2, Arterial 114.0 (H) 83.0 - 108.0 mmHg   Bicarbonate 19.5 (L) 20.0 - 28.0 mmol/L   TCO2 21 (L) 22 - 32 mmol/L   O2 Saturation 98.0 %   Acid-base deficit 5.0 (H) 0.0 - 2.0 mmol/L   Patient temperature 98.6 F    Collection site BRACHIAL ARTERY    Drawn by Operator    Sample type ARTERIAL    Dg Chest Portable 1 View  Result Date: 03/12/2017 CLINICAL DATA:  Respiratory distress. EXAM: PORTABLE CHEST 1 VIEW COMPARISON:  09/04/2016 FINDINGS: The heart size and mediastinal contours are within normal limits. Stable hyperinflation and emphysematous lung disease. There is no evidence of pulmonary edema, consolidation, pneumothorax, nodule or pleural fluid. The visualized skeletal structures are unremarkable. IMPRESSION: Stable emphysema. Electronically Signed   By: Aletta Edouard M.D.   On: 03/12/2017 11:36    Pending Labs Unresulted Labs (From admission, onward)   None      Vitals/Pain Today's Vitals   03/13/17 0122 03/13/17 0145 03/13/17 0215 03/13/17 0225  BP:    118/64  Pulse:  (!) 118 (!) 127   Resp:  (!) 32 (!) 24   Temp:      TempSrc:      SpO2: 95% 96% 98%     Isolation Precautions No active isolations  Medications Medications  cefTRIAXone (ROCEPHIN) 1 g in dextrose 5 % 50 mL IVPB (not administered)  predniSONE (DELTASONE) tablet 40 mg (not administered)  mometasone-formoterol (DULERA) 200-5 MCG/ACT inhaler 2 puff (not administered)  albuterol (PROVENTIL) (2.5 MG/3ML) 0.083% nebulizer solution 2.5 mg (not administered)  ipratropium (ATROVENT) nebulizer solution 0.5 mg (not administered)  0.9 %   sodium chloride infusion (not administered)  albuterol (PROVENTIL,VENTOLIN) solution continuous neb (15 mg/hr Nebulization Given 03/13/17 0121)  ipratropium (ATROVENT) nebulizer solution 1 mg (1 mg Nebulization Given 03/13/17 0121)    Mobility walks with person assist

## 2017-03-13 NOTE — Progress Notes (Signed)
Pt adamantly states "I want to be a DNR"  States he has signed paperwork at home.  Please note most recent historical code status from ED notes.   Currently orders are for him to be a full code.  Dr. Bruna PotterBlount on call and notified she acknowledges information.  WIll remain full code at this time and day MD to address with patient today.    Pt reports he is allergic to ALL food except biscuits, coffee, creamer and sugar.  He adds that he can also eat [place] food.  All food allergies reviewed that are in computer under allergies and pt states that information is correct and has nothing to add to it.  Food management notified in diet order comment section.  Pt is alert and oriented and education about ordering diet reviewed and monitoring food choices and trays.  He has requested that cafeteria only bring him biscuits,coffee, creamer and sugar.  A dietary consult has been placed.

## 2017-03-13 NOTE — Progress Notes (Signed)
RT called for PRN tx. Upon assessment, patient states that it is no longer needed and appears to be breathing without difficulty. RN at bedside,

## 2017-03-13 NOTE — H&P (Signed)
History and Physical    Jessica Townsend JWJ:[id number] DOB: November 07, 1950 DOA: 03/12/2017  PCP: [name], MD  Patient coming from:  home  Chief Complaint:  sob  HPI: Jessica Townsend is a 67 y.o. adult with medical history significant of copd, transgender female comes in with over a day of wheezing and sob, still smoking.  Denies fevers.  No swelling in legs.  No chest pain.  Pt not cooperative with answering questions.  Prior to my arrival into room pt was sitting in bed breathing comfortably, once I arrived into room and introduced myself she starting breathing heavily.  She tells me just to look everything up the computer and does not understand why she has to answer questions.  Pt referred for admission for copde.  Review of Systems: As per HPI otherwise 10 point review of systems negative.   Past Medical History:  Diagnosis Date  . Asthma   . COPD (chronic obstructive pulmonary disease) (HCC)   . Mood disorder (HCC)   . Smoker   . Trans-sexualism     No past surgical history on file.   reports that she has quit smoking. Her smoking use included cigarettes. She has a 4.00 pack-year smoking history. she has never used smokeless tobacco. She reports that she does not drink alcohol or use drugs.  Allergies  Allergen Reactions  . Antihistamines, Diphenhydramine-Type Anaphylaxis and Shortness Of Breath    CANNOT TOLERATE ANY!!  . Beef-Derived Products Anaphylaxis  . Chicken Protein Anaphylaxis  . Monosodium Glutamate Anaphylaxis  . Other Anaphylaxis and Shortness Of Breath    CANNED FRUITS, MOST SPICES, CHICKEN AND BEEF GREASE AND FAT, VEGETABLE OIL, PORK FAT   . Peanuts [Peanut Oil] Anaphylaxis    "I DIE"  . Pork-Derived Products Anaphylaxis  . Soap Anaphylaxis  . Sulfate Anaphylaxis  . Latex Rash  . Tape Rash    Band-aids are the worst     Family History  Problem Relation Age of Onset  . Hypertension Mother   . Hypertension Father   . Stroke Father   .  Ulcers Father   . Heart attack Father   . Asthma Sister   . Diabetes Paternal Uncle   . Cancer Maternal Grandmother   . Asthma Maternal Grandfather     Prior to Admission medications   Medication Sig Start Date End Date Taking? Authorizing Provider  ipratropium-albuterol (DUONEB) 0.5-2.5 (3) MG/3ML SOLN Inhale 3 mLs into the lungs every 6 (six) hours as needed for shortness of breath. 02/14/17  Yes [provider]  predniSONE (DELTASONE) 20 MG tablet Take 2 tablets (40 mg total) by mouth daily. 03/12/17  Yes Raeford Razor, MD  SYMBICORT 160-4.5 MCG/ACT inhaler INHALE 2 PUFFS INTO THE LUNGS 2 (TWO) TIMES DAILY. 02/21/17  Yes [name], MD  albuterol (PROVENTIL) (2.5 MG/3ML) 0.083% nebulizer solution INHALE 1 VIAL IN NEBULIZATION EVERY 6 HOURS AS NEEDED FOR WHEEZING OR SHORTNESS OF BREATH Patient not taking: Reported on 03/12/2017 09/07/16   [name], MD    Physical Exam: Vitals:   03/13/17 0215 03/13/17 0225 03/13/17 0557 03/13/17 0700  BP:  118/64    Pulse: (!) 127     Resp: (!) 24     Temp:      TempSrc:      SpO2: 98%  96% 96%      Constitutional:   Forcibly breathing hard and increased with very clear lung sounds and no wheezing Vitals:   03/13/17 0215 03/13/17 0225  03/13/17 0557 03/13/17 0700  BP:  118/64    Pulse: (!) 127     Resp: (!) 24     Temp:      TempSrc:      SpO2: 98%  96% 96%   Eyes: PERRL, lids and conjunctivae normal ENMT: Mucous membranes are moist. Posterior pharynx clear of any exudate or lesions.Normal dentition.  Neck: normal, supple, no masses, no thyromegaly Respiratory: clear to auscultation bilaterally, no wheezing, no crackles. Normal respiratory effort. No accessory muscle use. As above in constitutional Cardiovascular: Regular rate and rhythm, no murmurs / rubs / gallops. No extremity edema. 2+ pedal pulses. No carotid bruits.  Abdomen: no tenderness, no masses palpated. No hepatosplenomegaly. Bowel sounds positive.    Musculoskeletal: no clubbing / cyanosis. No joint deformity upper and lower extremities. Good ROM, no contractures. Normal muscle tone.  Skin: no rashes, lesions, ulcers. No induration Neurologic: CN 2-12 grossly intact. Sensation intact, DTR normal. Strength 5/5 in all 4.  Psychiatric: Normal judgment and insight. Alert and oriented x 3. Normal mood.    Labs on Admission: I have personally reviewed following labs and imaging studies  CBC: Recent Labs  Lab 03/12/17 1107  WBC 6.8  NEUTROABS 3.8  HGB 13.7  HCT 40.4  MCV 91.0  PLT 283   Basic Metabolic Panel: Recent Labs  Lab 03/12/17 1107  NA 136  K 3.7  CL 104  CO2 20*  GLUCOSE 105*  BUN 14  CREATININE 1.44*  CALCIUM 8.9   GFR: CrCl cannot be calculated (Unknown ideal weight.). Liver Function Tests: No results for input(s): AST, ALT, ALKPHOS, BILITOT, PROT, ALBUMIN in the last 168 hours. No results for input(s): LIPASE, AMYLASE in the last 168 hours. No results for input(s): AMMONIA in the last 168 hours. Coagulation Profile: No results for input(s): INR, PROTIME in the last 168 hours. Cardiac Enzymes: No results for input(s): CKTOTAL, CKMB, CKMBINDEX, TROPONINI in the last 168 hours. BNP (last 3 results) No results for input(s): PROBNP in the last 8760 hours. HbA1C: No results for input(s): HGBA1C in the last 72 hours. CBG: No results for input(s): GLUCAP in the last 168 hours. Lipid Profile: No results for input(s): CHOL, HDL, LDLCALC, TRIG, CHOLHDL, LDLDIRECT in the last 72 hours. Thyroid Function Tests: No results for input(s): TSH, T4TOTAL, FREET4, T3FREE, THYROIDAB in the last 72 hours. Anemia Panel: No results for input(s): VITAMINB12, FOLATE, FERRITIN, TIBC, IRON, RETICCTPCT in the last 72 hours. Urine analysis:    Component Value Date/Time   COLORURINE YELLOW 11/06/2014 1510   APPEARANCEUR CLEAR 11/06/2014 1510   LABSPEC 1.019 11/06/2014 1510   PHURINE 5.0 11/06/2014 1510   GLUCOSEU NEGATIVE  11/06/2014 1510   HGBUR NEGATIVE 11/06/2014 1510   BILIRUBINUR NEGATIVE 11/06/2014 1510   KETONESUR 15 (A) 11/06/2014 1510   PROTEINUR NEGATIVE 11/06/2014 1510   UROBILINOGEN 0.2 11/06/2014 1510   NITRITE NEGATIVE 11/06/2014 1510   LEUKOCYTESUR NEGATIVE 11/06/2014 1510   Sepsis Labs: !!!!!!!!!!!!!!!!!!!!!!!!!!!!!!!!!!!!!!!!!!!! @LABRCNTIP (procalcitonin:4,lacticidven:4) )No results found for this or any previous visit (from the past 240 hour(s)).   Radiological Exams on Admission: Dg Chest Portable 1 View  Result Date: 03/12/2017 CLINICAL DATA:  Respiratory distress. EXAM: PORTABLE CHEST 1 VIEW COMPARISON:  09/04/2016 FINDINGS: The heart size and mediastinal contours are within normal limits. Stable hyperinflation and emphysematous lung disease. There is no evidence of pulmonary edema, consolidation, pneumothorax, nodule or pleural fluid. The visualized skeletal structures are unremarkable. IMPRESSION: Stable emphysema. Electronically Signed   By: Rudene AndaGlenn  Yamagata M.D.  On: 03/12/2017 11:36    Old chart reviewed cxr reviewed no edema or infiltrate  Assessment/Plan 67 yo transgender female comes in with copde  Principal Problem:   COPD exacerbation (HCC)- freq nebs.  Po prednisone.  Lungs clear.  Wean oxgyen.  cxr neg.  Rocephin.  Active Problems:   CKD (chronic kidney disease), stage II- stable   Trans-sexualism- noted     DVT prophylaxis:  Ambulate, scds Code Status:  full Family Communication:  none Disposition Plan:  Per day team Consults called:  none Admission status:  observation   Coty Student A MD [facility]  If 7PM-7AM, please contact night-coverage [email or web address] Password [facility]  03/13/2017, 7:43 AM

## 2017-03-14 ENCOUNTER — Encounter (HOSPITAL_COMMUNITY): Payer: Self-pay

## 2017-03-14 ENCOUNTER — Other Ambulatory Visit: Payer: Self-pay

## 2017-03-14 DIAGNOSIS — Z825 Family history of asthma and other chronic lower respiratory diseases: Secondary | ICD-10-CM | POA: Diagnosis not present

## 2017-03-14 DIAGNOSIS — R0902 Hypoxemia: Secondary | ICD-10-CM | POA: Diagnosis present

## 2017-03-14 DIAGNOSIS — F39 Unspecified mood [affective] disorder: Secondary | ICD-10-CM | POA: Diagnosis present

## 2017-03-14 DIAGNOSIS — F17210 Nicotine dependence, cigarettes, uncomplicated: Secondary | ICD-10-CM | POA: Diagnosis present

## 2017-03-14 DIAGNOSIS — F640 Transsexualism: Secondary | ICD-10-CM | POA: Diagnosis present

## 2017-03-14 DIAGNOSIS — Z7951 Long term (current) use of inhaled steroids: Secondary | ICD-10-CM | POA: Diagnosis not present

## 2017-03-14 DIAGNOSIS — Z91040 Latex allergy status: Secondary | ICD-10-CM | POA: Diagnosis not present

## 2017-03-14 DIAGNOSIS — Z833 Family history of diabetes mellitus: Secondary | ICD-10-CM | POA: Diagnosis not present

## 2017-03-14 DIAGNOSIS — Z91010 Allergy to peanuts: Secondary | ICD-10-CM | POA: Diagnosis not present

## 2017-03-14 DIAGNOSIS — Z79899 Other long term (current) drug therapy: Secondary | ICD-10-CM | POA: Diagnosis not present

## 2017-03-14 DIAGNOSIS — Z8249 Family history of ischemic heart disease and other diseases of the circulatory system: Secondary | ICD-10-CM | POA: Diagnosis not present

## 2017-03-14 DIAGNOSIS — Z823 Family history of stroke: Secondary | ICD-10-CM | POA: Diagnosis not present

## 2017-03-14 DIAGNOSIS — Z888 Allergy status to other drugs, medicaments and biological substances status: Secondary | ICD-10-CM | POA: Diagnosis not present

## 2017-03-14 DIAGNOSIS — Z91018 Allergy to other foods: Secondary | ICD-10-CM | POA: Diagnosis not present

## 2017-03-14 DIAGNOSIS — N182 Chronic kidney disease, stage 2 (mild): Secondary | ICD-10-CM | POA: Diagnosis not present

## 2017-03-14 DIAGNOSIS — Z809 Family history of malignant neoplasm, unspecified: Secondary | ICD-10-CM | POA: Diagnosis not present

## 2017-03-14 DIAGNOSIS — Z91048 Other nonmedicinal substance allergy status: Secondary | ICD-10-CM | POA: Diagnosis not present

## 2017-03-14 DIAGNOSIS — J441 Chronic obstructive pulmonary disease with (acute) exacerbation: Secondary | ICD-10-CM | POA: Diagnosis not present

## 2017-03-14 MED ORDER — ENSURE ENLIVE PO LIQD: 237.0000 mL | Freq: Two times a day (BID) | ORAL | Status: DC

## 2017-03-14 MED ORDER — METHYLPREDNISOLONE SODIUM SUCC 40 MG IJ SOLR
40.0000 mg | Freq: Three times a day (TID) | INTRAMUSCULAR | Status: DC
  Administered 2017-03-14 – 2017-03-15 (×3): 40 mg via INTRAVENOUS
  Filled 2017-03-14 (×3): qty 1

## 2017-03-14 NOTE — Progress Notes (Signed)
Pt refusing heart healthy diet at this time. Pt educated on diet and need for a heart healthy diet. Dr. Hanley BenAlekh made aware and changed diet to regular.

## 2017-03-14 NOTE — Progress Notes (Signed)
Patient ID: Jessica Townsend, Jessica Townsend   DOB: 10-19-1950, 67 y.o.   MRN: [id number]  PROGRESS NOTE    Jessica Townsend  WUJ:[id number] DOB: [birth year] DOA: 03/12/2017 PCP: [name], MD   Brief Narrative:  67 year old transgender female with history of COPD presented with worsening shortness of breath and wheezing.  She was admitted with COPD exacerbation.   Assessment & Plan:   Principal Problem:   COPD exacerbation (HCC) Active Problems:   CKD (chronic kidney disease), stage II   Trans-sexualism   Hypoxia  COPD exacerbation -Still wheezing.  Discontinue prednisone.  Start Solu-Medrol 40 mg IV every 8 hours.  Continue Dulera although patient states that only Symbicort helps her but patient does not have her home Symbicort here with her.  Continue duo nebs -Outpatient follow-up with pulmonary -Oxygen supplementation as needed -Incentive spirometry -DC Rocephin  Chronic kidney disease stage II -Check a.m. labs    DVT prophylaxis: Ambulate, SCDs Code Status: Full Family Communication: None at bedside Disposition Plan: Home in 1-2 days  Consultants: None  Procedures: None  Antimicrobials: Rocephin   Subjective: Patient seen and examined at bedside.  He still complains of shortness of breath and coughing.  No overnight fever or vomiting.  No chest pain currently  Objective: Vitals:   03/13/17 2054 03/14/17 0009 03/14/17 0524 03/14/17 0802  BP: 119/69  111/65   Pulse: 80  88   Resp: 18  20   Temp: 97.7 F (36.5 C)  98.6 F (37 C)   TempSrc: Oral  Oral   SpO2: 96% 96% 95% 93%  Weight:      Height:        Intake/Output Summary (Last 24 hours) at 03/14/2017 1002 Last data filed at 03/14/2017 0600 Gross per 24 hour  Intake 3401.33 ml  Output 1700 ml  Net 1701.33 ml   Filed Weights   03/13/17 0340  Weight: 63.4 kg (139 lb 12.8 oz)    Examination:  General exam: Appears calm and comfortable  Respiratory system: Bilateral decreased breath sound  at bases with diffuse scattered wheezing Cardiovascular system: S1 & S2 heard, rate controlled  gastrointestinal system: Abdomen is nondistended, soft and nontender. Normal bowel sounds heard. Extremities: No cyanosis, clubbing, edema   Data Reviewed: I have personally reviewed following labs and imaging studies  CBC: Recent Labs  Lab 03/12/17 1107  WBC 6.8  NEUTROABS 3.8  HGB 13.7  HCT 40.4  MCV 91.0  PLT 283   Basic Metabolic Panel: Recent Labs  Lab 03/12/17 1107  NA 136  K 3.7  CL 104  CO2 20*  GLUCOSE 105*  BUN 14  CREATININE 1.44*  CALCIUM 8.9   GFR: Estimated Creatinine Clearance (by C-G formula based on SCr of 1.44 mg/dL (H)) Female: 39 mL/min (A) Female: 45.9 mL/min (A) Liver Function Tests: No results for input(s): AST, ALT, ALKPHOS, BILITOT, PROT, ALBUMIN in the last 168 hours. No results for input(s): LIPASE, AMYLASE in the last 168 hours. No results for input(s): AMMONIA in the last 168 hours. Coagulation Profile: No results for input(s): INR, PROTIME in the last 168 hours. Cardiac Enzymes: No results for input(s): CKTOTAL, CKMB, CKMBINDEX, TROPONINI in the last 168 hours. BNP (last 3 results) No results for input(s): PROBNP in the last 8760 hours. HbA1C: No results for input(s): HGBA1C in the last 72 hours. CBG: No results for input(s): GLUCAP in the last 168 hours. Lipid Profile: No results for input(s): CHOL, HDL, LDLCALC, TRIG, CHOLHDL, LDLDIRECT in the last  72 hours. Thyroid Function Tests: No results for input(s): TSH, T4TOTAL, FREET4, T3FREE, THYROIDAB in the last 72 hours. Anemia Panel: No results for input(s): VITAMINB12, FOLATE, FERRITIN, TIBC, IRON, RETICCTPCT in the last 72 hours. Sepsis Labs: No results for input(s): PROCALCITON, LATICACIDVEN in the last 168 hours.  No results found for this or any previous visit (from the past 240 hour(s)).       Radiology Studies: Dg Chest Portable 1 View  Result Date: 03/12/2017 CLINICAL  DATA:  Respiratory distress. EXAM: PORTABLE CHEST 1 VIEW COMPARISON:  09/04/2016 FINDINGS: The heart size and mediastinal contours are within normal limits. Stable hyperinflation and emphysematous lung disease. There is no evidence of pulmonary edema, consolidation, pneumothorax, nodule or pleural fluid. The visualized skeletal structures are unremarkable. IMPRESSION: Stable emphysema. Electronically Signed   By: Irish LackGlenn  Yamagata M.D.   On: 03/12/2017 11:36        Scheduled Meds: . ipratropium  0.5 mg Nebulization BID  . mometasone-formoterol  2 puff Inhalation BID  . predniSONE  40 mg Oral Q breakfast   Continuous Infusions: . sodium chloride 125 mL/hr at 03/14/17 0429  . cefTRIAXone (ROCEPHIN)  IV Stopped (03/14/17 0500)     LOS: 0 days        Glade LloydKshitiz Elic Vencill, MD [facility] Pager [phone]  If 7PM-7AM, please contact night-coverage [email or web address] Password [email or web address] 03/14/2017, 10:02 AM

## 2017-03-14 NOTE — Care Management Obs Status (Signed)
MEDICARE OBSERVATION STATUS NOTIFICATION   Patient Details  Name: Jessica Townsend MRN: [id number] Date of Birth: [birth year]   Medicare Observation Status Notification Given:  Yes    MahabirOlegario Messier, Nichlos Kunzler, RN 03/14/2017, 3:06 PM

## 2017-03-14 NOTE — Progress Notes (Signed)
Nutrition Brief Note  Consult per COPD Gold protocol.   Wt Readings from Last 15 Encounters:  03/13/17 139 lb 12.8 oz (63.4 kg)  09/16/16 151 lb (68.5 kg)  09/05/16 145 lb 14.4 oz (66.2 kg)  12/16/15 134 lb (60.8 kg)  11/10/15 136 lb (61.7 kg)  02/27/15 146 lb (66.2 kg)  12/17/14 150 lb (68 kg)  11/14/14 143 lb 3.2 oz (65 kg)  11/09/14 138 lb 7.2 oz (62.8 kg)  11/05/14 139 lb (63 kg)  08/06/14 148 lb (67.1 kg)  07/25/13 141 lb (64 kg)  02/19/13 149 lb (67.6 kg)  11/04/11 154 lb (69.9 kg)  05/31/11 146 lb (66.2 kg)    Body mass index is 18.44 kg/m. Patient meets criteria for underweight based on current BMI. Per review, patient gained 6 lbs from 09/05/16-09/16/16. She then lost 12 lbs (8% body weight) from 09/16/16-03/13/17. This is not significant for time frame. Patient is transgender (female-to-female) with hx of COPD. She is currently OBS for COPD exacerbation and CKD stage 2.  Current diet order is Heart Healthy. Reviewed food allergies listed in chart which are:  - beef-derived products - chicken protein - MSG - canned fruits - most spices - chicken and beef grease and fat  - vegetable oil - pork fat - peanuts/peanut oil ("I die") - pork-derived products  All items are characterized with high severity and reaction of anaphylaxis. Note from RN yesterday at 6: 25 AM states that pt reported being allergic to ALL food except biscuits, coffee, creamer, sugar, and [place] food. This is interesting as meat products, peanuts/peanut oil, and MSG are all highly common in [place] food and these foods obtained at a restaurant are highly likely to be concerning for cross-contamination.   Medications reviewed; 40 mg Solu-medrol TID.  Labs reviewed; creatinine: 1.44 mg/dL, GFR: 37 mL/min.  Patient is a/o x4 and is capable [facility], which is in the room. Will trial Ensure Enlive po BID, each supplement provides 350 kcal and 20  grams of protein. Highly doubt that patient will accept/consume this supplement.  No further nutrition interventions warranted at this time. If nutrition issues arise, please consult RD.      Jessica GammonJessica Velia Pamer, MS, RD, LDN, [facility] Inpatient Clinical Dietitian Pager # [phone] After hours/weekend pager # [phone]

## 2017-03-14 NOTE — Care Management Note (Signed)
Case Management Note  Patient Details  Name: Jessica Townsend MRN: [id number] Date of Birth: 1950-06-14  Subjective/Objective:66 y/o f admitted w/COPD. From home.                    Action/Plan:d/c home.   Expected Discharge Date:  03/16/17               Expected Discharge Plan:  Home/Self Care  In-House Referral:     Discharge planning Services  CM Consult  Post Acute Care Choice:    Choice offered to:     DME Arranged:    DME Agency:     HH Arranged:    HH Agency:     Status of Service:  In process, will continue to follow  If discussed at Long Length of Stay Meetings, dates discussed:    Additional Comments:  Jessica Townsend, Jessica Migliaccio, RN 03/14/2017, 3:07 PM

## 2017-03-14 NOTE — Progress Notes (Signed)
Pt called out c/o diaphoresis, feeling clammy, tightness in the chest, shob, and felt "like passing out." Vital signs were obtained and stable. Pt was asymptomatic after a moment. On call provider paged. No new orders given. Will continue to monitor.

## 2017-03-15 LAB — CBC WITH DIFFERENTIAL/PLATELET
Basophils Absolute: 0 10*3/uL (ref 0.0–0.1)
Basophils Relative: 0 %
EOS PCT: 0 %
Eosinophils Absolute: 0 10*3/uL (ref 0.0–0.7)
HEMATOCRIT: 36.7 % (ref 36.0–46.0)
Hemoglobin: 12.5 g/dL (ref 12.0–15.0)
LYMPHS PCT: 16 %
Lymphs Abs: 1.7 10*3/uL (ref 0.7–4.0)
MCH: 30.7 pg (ref 26.0–34.0)
MCHC: 34.1 g/dL (ref 30.0–36.0)
MCV: 90.2 fL (ref 78.0–100.0)
MONO ABS: 0.6 10*3/uL (ref 0.1–1.0)
MONOS PCT: 6 %
NEUTROS ABS: 8.2 10*3/uL — AB (ref 1.7–7.7)
Neutrophils Relative %: 78 %
PLATELETS: 241 10*3/uL (ref 150–400)
RBC: 4.07 MIL/uL (ref 3.87–5.11)
RDW: 14.6 % (ref 11.5–15.5)
WBC: 10.6 10*3/uL — ABNORMAL HIGH (ref 4.0–10.5)

## 2017-03-15 LAB — BASIC METABOLIC PANEL
Anion gap: 8 (ref 5–15)
BUN: 20 mg/dL (ref 6–20)
CALCIUM: 8.3 mg/dL — AB (ref 8.9–10.3)
CO2: 25 mmol/L (ref 22–32)
CREATININE: 0.95 mg/dL (ref 0.44–1.00)
Chloride: 106 mmol/L (ref 101–111)
GFR calc Af Amer: >60 mL/min (ref >60)
GLUCOSE: 120 mg/dL — AB (ref 65–99)
Potassium: 4.2 mmol/L (ref 3.5–5.1)
Sodium: 139 mmol/L (ref 135–145)

## 2017-03-15 LAB — MAGNESIUM: Magnesium: 2.5 mg/dL — ABNORMAL HIGH (ref 1.7–2.4)

## 2017-03-15 MED ORDER — PREDNISONE 20 MG PO TABS: 40.0000 mg | ORAL_TABLET | Freq: Every day | ORAL | 0 refills | Status: AC

## 2017-03-15 MED ORDER — ALBUTEROL SULFATE HFA 108 (90 BASE) MCG/ACT IN AERS: 2.0000 | INHALATION_SPRAY | Freq: Four times a day (QID) | RESPIRATORY_TRACT | 0 refills | Status: DC | PRN

## 2017-03-15 NOTE — Discharge Summary (Signed)
Physician Discharge Summary  Jessica MatasMalinda L Townsend [id number] DOB: 1950/04/19 DOA: 03/12/2017  PCP: Jessica NianLalonde, Jessica C, MD  Admit date: 03/12/2017 Discharge date: 03/15/2017  Admitted From: Home Disposition: Home  Recommendations for Outpatient Follow-up:  1. Follow up with PCP in 1 week 2. Recommend to follow-up with pulmonology as an outpatient  Home Health: No Equipment/Devices: None  Discharge Condition: Stable CODE STATUS: Full Diet recommendation: Heart Healthy   Brief/Interim Summary: 67 year old transgender female with history of COPD presented with worsening shortness of breath and wheezing.  She was admitted with COPD exacerbation.  Intravenous Solu-Medrol was started.  Condition is improving.  She will be switched to oral prednisone and discharged home as she is feeling little better.  She will benefit from outpatient pulmonary evaluation.    Discharge Diagnoses:  Principal Problem:   COPD exacerbation (HCC) Active Problems:   CKD (chronic kidney disease), stage II   Trans-sexualism   Hypoxia  COPD exacerbation -Currently on sorry Medrol.  Still mildly wheezing but feels much better and wants to go home.  Will switch to oral prednisone 40 mg daily for at least 7 days then can be discontinued if he feels better or can be tapered by primary care provider.  Continue Symbicort and Duonebs at home.  -Outpatient evaluation and follow-up with pulmonary.  Patient might benefit from outpatient evaluation by an allergist as he mentions multiple allergies  Chronic kidney disease stage II -Stable.  Outpatient follow-up     Discharge Instructions  Discharge Instructions    Ambulatory referral to Pulmonology   Complete by:  As directed    COPD exacerbation   Call MD for:  difficulty breathing, headache or visual disturbances   Complete by:  As directed    Call MD for:  extreme fatigue   Complete by:  As directed    Call MD for:  hives   Complete by:  As directed     Call MD for:  persistant dizziness or light-headedness   Complete by:  As directed    Call MD for:  persistant nausea and vomiting   Complete by:  As directed    Call MD for:  severe uncontrolled pain   Complete by:  As directed    Call MD for:  temperature >100.4   Complete by:  As directed    Diet - low sodium heart healthy   Complete by:  As directed    Increase activity slowly   Complete by:  As directed      Allergies as of 03/15/2017      Reactions   Antihistamines, Diphenhydramine-type Anaphylaxis, Shortness Of Breath   CANNOT TOLERATE ANY!!   Beef-derived Products Anaphylaxis   Chicken Protein Anaphylaxis   Monosodium Glutamate Anaphylaxis   Other Anaphylaxis, Shortness Of Breath   CANNED FRUITS, MOST SPICES, CHICKEN AND BEEF GREASE AND FAT, VEGETABLE OIL, PORK FAT    Peanuts [peanut Oil] Anaphylaxis   "I DIE"   Pork-derived Products Anaphylaxis   Soap Anaphylaxis   Sulfate Anaphylaxis   Latex Rash   Tape Rash   Band-aids are the worst       Medication List    STOP taking these medications   albuterol (2.5 MG/3ML) 0.083% nebulizer solution Commonly known as:  PROVENTIL Replaced by:  albuterol 108 (90 Base) MCG/ACT inhaler     TAKE these medications   albuterol 108 (90 Base) MCG/ACT inhaler Commonly known as:  PROVENTIL HFA;VENTOLIN HFA Inhale 2 puffs into the lungs every 6 (six) hours as  needed for wheezing or shortness of breath. Replaces:  albuterol (2.5 MG/3ML) 0.083% nebulizer solution   ipratropium-albuterol 0.5-2.5 (3) MG/3ML Soln Commonly known as:  DUONEB Inhale 3 mLs into the lungs every 6 (six) hours as needed for shortness of breath.   predniSONE 20 MG tablet Commonly known as:  DELTASONE Take 2 tablets (40 mg total) by mouth daily for 7 days.   SYMBICORT 160-4.5 MCG/ACT inhaler Generic drug:  budesonide-formoterol INHALE 2 PUFFS INTO THE LUNGS 2 (TWO) TIMES DAILY.      Follow-up Information    Jessica Nian, MD. Schedule an  appointment as soon as possible for a visit.   Specialty:  Family Medicine Why:  at earliest convenience Contact information: [address] [place] [address] [phone]          Allergies  Allergen Reactions  . Antihistamines, Diphenhydramine-Type Anaphylaxis and Shortness Of Breath    CANNOT TOLERATE ANY!!  . Beef-Derived Products Anaphylaxis  . Chicken Protein Anaphylaxis  . Monosodium Glutamate Anaphylaxis  . Other Anaphylaxis and Shortness Of Breath    CANNED FRUITS, MOST SPICES, CHICKEN AND BEEF GREASE AND FAT, VEGETABLE OIL, PORK FAT   . Peanuts [Peanut Oil] Anaphylaxis    "I DIE"  . Pork-Derived Products Anaphylaxis  . Soap Anaphylaxis  . Sulfate Anaphylaxis  . Latex Rash  . Tape Rash    Band-aids are the worst     Consultations:  None   Procedures/Studies: Dg Chest Portable 1 View  Result Date: 03/12/2017 CLINICAL DATA:  Respiratory distress. EXAM: PORTABLE CHEST 1 VIEW COMPARISON:  09/04/2016 FINDINGS: The heart size and mediastinal contours are within normal limits. Stable hyperinflation and emphysematous lung disease. There is no evidence of pulmonary edema, consolidation, pneumothorax, nodule or pleural fluid. The visualized skeletal structures are unremarkable. IMPRESSION: Stable emphysema. Electronically Signed   By: Irish Lack M.D.   On: 03/12/2017 11:36     Subjective: Patient seen and examined at bedside.  He feels better.  No overnight fever, nausea or vomiting.  His breathing is better.  Discharge Exam: Vitals:   03/14/17 2043 03/15/17 0552  BP:  129/63  Pulse:  70  Resp:  20  Temp:  98 F (36.7 C)  SpO2: 97% 96%   Vitals:   03/14/17 2022 03/14/17 2041 03/14/17 2043 03/15/17 0552  BP: 125/74   129/63  Pulse: 72   70  Resp: 20   20  Temp: 97.8 F (36.6 C)   98 F (36.7 C)  TempSrc: Oral   Oral  SpO2: 97% 97% 97% 96%  Weight:      Height:        General: Pt is alert, awake, not in acute  distress Cardiovascular: Rate controlled, S1/S2 + Respiratory: Bilateral decreased breath sounds at bases with mild wheezing improved since yesterday Abdominal: Soft, NT, ND, bowel sounds + Extremities: no edema, no cyanosis    The results of significant diagnostics from this hospitalization (including imaging, microbiology, ancillary and laboratory) are listed below for reference.     Microbiology: No results found for this or any previous visit (from the past 240 hour(s)).   Labs: BNP (last 3 results) No results for input(s): BNP in the last 8760 hours. Basic Metabolic Panel: Recent Labs  Lab 03/12/17 1107 03/15/17 0616  NA 136 139  K 3.7 4.2  CL 104 106  CO2 20* 25  GLUCOSE 105* 120*  BUN 14 20  CREATININE 1.44* 0.95  CALCIUM 8.9 8.3*  MG  --  2.5*   Liver Function Tests: No results for input(s): AST, ALT, ALKPHOS, BILITOT, PROT, ALBUMIN in the last 168 hours. No results for input(s): LIPASE, AMYLASE in the last 168 hours. No results for input(s): AMMONIA in the last 168 hours. CBC: Recent Labs  Lab 03/12/17 1107 03/15/17 0616  WBC 6.8 10.6*  NEUTROABS 3.8 8.2*  HGB 13.7 12.5  HCT 40.4 36.7  MCV 91.0 90.2  PLT 283 241   Cardiac Enzymes: No results for input(s): CKTOTAL, CKMB, CKMBINDEX, TROPONINI in the last 168 hours. BNP: Invalid input(s): POCBNP CBG: No results for input(s): GLUCAP in the last 168 hours. D-Dimer No results for input(s): DDIMER in the last 72 hours. Hgb A1c No results for input(s): HGBA1C in the last 72 hours. Lipid Profile No results for input(s): CHOL, HDL, LDLCALC, TRIG, CHOLHDL, LDLDIRECT in the last 72 hours. Thyroid function studies No results for input(s): TSH, T4TOTAL, T3FREE, THYROIDAB in the last 72 hours.  Invalid input(s): FREET3 Anemia work up No results for input(s): VITAMINB12, FOLATE, FERRITIN, TIBC, IRON, RETICCTPCT in the last 72 hours. Urinalysis    Component Value Date/Time   COLORURINE YELLOW 11/06/2014  1510   APPEARANCEUR CLEAR 11/06/2014 1510   LABSPEC 1.019 11/06/2014 1510   PHURINE 5.0 11/06/2014 1510   GLUCOSEU NEGATIVE 11/06/2014 1510   HGBUR NEGATIVE 11/06/2014 1510   BILIRUBINUR NEGATIVE 11/06/2014 1510   KETONESUR 15 (A) 11/06/2014 1510   PROTEINUR NEGATIVE 11/06/2014 1510   UROBILINOGEN 0.2 11/06/2014 1510   NITRITE NEGATIVE 11/06/2014 1510   LEUKOCYTESUR NEGATIVE 11/06/2014 1510   Sepsis Labs Invalid input(s): PROCALCITONIN,  WBC,  LACTICIDVEN Microbiology No results found for this or any previous visit (from the past 240 hour(s)).   Time coordinating discharge: 35 minutes  SIGNED:   Glade Lloyd, MD  [facility] 03/15/2017, 8:37 AM Pager: [phone]  If 7PM-7AM, please contact night-coverage [email or web address] Password [email or web address]

## 2017-03-16 ENCOUNTER — Telehealth: Payer: Self-pay | Admitting: Family Medicine

## 2017-03-16 NOTE — Telephone Encounter (Signed)
Pt called to make a hospital follow up appt. Pt was agitated and I was unable to completed any conversation concerning her medications. Every time I attempted to ask her a question I was interrupted. I do not know if pt understands her medications or not. Under the circumstances I made the pt an appt for tomorrow. She was asked to bring all her meds with her. Transition of care criteria was unable to be met due to pt's agitated state.

## 2017-03-17 ENCOUNTER — Encounter: Payer: Self-pay | Admitting: Family Medicine

## 2017-03-17 ENCOUNTER — Ambulatory Visit (INDEPENDENT_AMBULATORY_CARE_PROVIDER_SITE_OTHER): Payer: Medicare Other | Admitting: Family Medicine

## 2017-03-17 VITALS — BP 110/76 | HR 97 | Wt 139.8 lb

## 2017-03-17 DIAGNOSIS — J441 Chronic obstructive pulmonary disease with (acute) exacerbation: Secondary | ICD-10-CM | POA: Diagnosis not present

## 2017-03-17 NOTE — Progress Notes (Signed)
   Subjective:    Patient ID: Jessica MatasMalinda L Townsend, adult    DOB: 1950/05/30, 67 y.o.   MRN: [id number]  HPI She is here for recheck after recent hospitalization.  She was admitted for a COPD exacerbation.  She was given steroids as well as nebulizer treatments.  Review of the record indicates that she did respond to that and was sent home on a steroid regimen that she should finish in roughly 1 week.  Presently she is having no chest pain, shortness of breath and continues on her inhalers.   Review of Systems     Objective:   Physical Exam Alert and in no distress.  Lungs are clear to auscultation.  Cardiac exam shows regular rhythm without murmurs or gallops.       Assessment & Plan:  COPD exacerbation (HCC) I reviewed her medications.  She is taking them appropriately.  She was given an inhaler in the hospital which was essentially equivalent to her Symbicort.  Recommend that she use that up and then switch back to her regular Symbicort dosing as well as her other inhaler.

## 2017-05-06 ENCOUNTER — Telehealth: Payer: Self-pay | Admitting: Family Medicine

## 2017-05-06 MED ORDER — IPRATROPIUM-ALBUTEROL 0.5-2.5 (3) MG/3ML IN SOLN: 3.0000 mL | Freq: Four times a day (QID) | RESPIRATORY_TRACT | 3 refills | Status: DC | PRN

## 2017-05-06 NOTE — Telephone Encounter (Signed)
Pt came in and requested refills on Duoneb 0.5-2.5. She states that this was given to her when she was in the hospital. [facility] [address] Pt can be reached at [phone].

## 2017-09-06 ENCOUNTER — Other Ambulatory Visit: Payer: Self-pay | Admitting: Family Medicine

## 2017-09-06 NOTE — Telephone Encounter (Signed)
please advise if pt albuterol solu. [facility]

## 2017-11-01 ENCOUNTER — Other Ambulatory Visit: Payer: Self-pay | Admitting: Family Medicine

## 2017-11-01 DIAGNOSIS — J4530 Mild persistent asthma, uncomplicated: Secondary | ICD-10-CM

## 2017-11-01 NOTE — Telephone Encounter (Signed)
[facility] is requesting to fill pt symbicort. Please advise kh

## 2017-11-02 ENCOUNTER — Telehealth: Payer: Self-pay | Admitting: Family Medicine

## 2017-11-02 NOTE — Telephone Encounter (Signed)
Pt called to state her Oxygen level has been stable for past 1-2 months 97-98, little fluid in lungs as long as she stays inside, no serious respiratory attacks.  She wanted to let you know.

## 2017-12-20 ENCOUNTER — Telehealth: Payer: Self-pay

## 2017-12-21 NOTE — Telephone Encounter (Signed)
ERROR

## 2018-01-10 ENCOUNTER — Encounter: Payer: Self-pay | Admitting: Family Medicine

## 2018-01-10 ENCOUNTER — Ambulatory Visit (INDEPENDENT_AMBULATORY_CARE_PROVIDER_SITE_OTHER): Payer: Medicare Other | Admitting: Family Medicine

## 2018-01-10 VITALS — BP 100/68 | HR 66 | Temp 97.9°F | Wt 142.0 lb

## 2018-01-10 DIAGNOSIS — Z1211 Encounter for screening for malignant neoplasm of colon: Secondary | ICD-10-CM | POA: Diagnosis not present

## 2018-01-10 DIAGNOSIS — J4530 Mild persistent asthma, uncomplicated: Secondary | ICD-10-CM | POA: Diagnosis not present

## 2018-01-10 DIAGNOSIS — F640 Transsexualism: Secondary | ICD-10-CM | POA: Diagnosis not present

## 2018-01-10 DIAGNOSIS — Z1159 Encounter for screening for other viral diseases: Secondary | ICD-10-CM

## 2018-01-10 DIAGNOSIS — J449 Chronic obstructive pulmonary disease, unspecified: Secondary | ICD-10-CM | POA: Diagnosis not present

## 2018-01-10 DIAGNOSIS — Z23 Encounter for immunization: Secondary | ICD-10-CM | POA: Diagnosis not present

## 2018-01-10 DIAGNOSIS — Z13220 Encounter for screening for lipoid disorders: Secondary | ICD-10-CM

## 2018-01-10 DIAGNOSIS — N182 Chronic kidney disease, stage 2 (mild): Secondary | ICD-10-CM | POA: Diagnosis not present

## 2018-01-10 DIAGNOSIS — J4540 Moderate persistent asthma, uncomplicated: Secondary | ICD-10-CM

## 2018-01-10 DIAGNOSIS — Z720 Tobacco use: Secondary | ICD-10-CM | POA: Diagnosis not present

## 2018-01-10 DIAGNOSIS — Z789 Other specified health status: Secondary | ICD-10-CM

## 2018-01-10 MED ORDER — IPRATROPIUM-ALBUTEROL 0.5-2.5 (3) MG/3ML IN SOLN: 3.0000 mL | Freq: Four times a day (QID) | RESPIRATORY_TRACT | 3 refills | Status: DC | PRN

## 2018-01-10 MED ORDER — BUDESONIDE-FORMOTEROL FUMARATE 160-4.5 MCG/ACT IN AERO: 2.0000 | INHALATION_SPRAY | Freq: Two times a day (BID) | RESPIRATORY_TRACT | 5 refills | Status: DC

## 2018-01-10 NOTE — Progress Notes (Signed)
   Subjective:    Patient ID: Jessica Townsend, adult    DOB: 11/09/1950, 67 y.o.   MRN: [id number]  HPI She is here for an interval evaluation.  She continues on her pulmonary meds.She does continue to smoke but only a couple cigarettes per day. She does not complain of shortness of breath, coughing or wheezing.  Does have a history of stage II CKD.  He is not on any hormone replacement.  She does not drink.  She does not work. Past medical history was reviewed.  She does need follow-up immunizations and Cologuard. Review of Systems     Objective:   Physical Exam Alert and in no distress. Tympanic membranes and canals are normal. Pharyngeal area is normal. Neck is supple without adenopathy or thyromegaly. Cardiac exam shows a regular sinus rhythm without murmurs or gallops. Lungs are clear to auscultation.        Assessment & Plan:  Chronic obstructive pulmonary disease, unspecified COPD type (HCC) - Plan: ipratropium-albuterol (DUONEB) 0.5-2.5 (3) MG/3ML SOLN, budesonide-formoterol (SYMBICORT) 160-4.5 MCG/ACT inhaler, CBC with Differential/Platelet, Comprehensive metabolic panel  Moderate persistent asthma without complication  Need for vaccination against Streptococcus pneumoniae - Plan: Pneumococcal conjugate vaccine 13-valent  CKD (chronic kidney disease), stage II  Current occasional smoker  Female-to-female transgender person  Mild persistent asthma without complication - Plan: budesonide-formoterol (SYMBICORT) 160-4.5 MCG/ACT inhaler  Screening for colon cancer - Plan: Cologuard  Encounter for hepatitis C screening test for low risk patient - Plan: Hepatitis C antibody  Screening for lipid disorders - Plan: Lipid panel  I again cautioned her that it would be a good idea to get rid of all cigarettes.  Continue on her present medications.  We will also order a Cologuard

## 2018-01-11 ENCOUNTER — Encounter: Payer: Self-pay | Admitting: Family Medicine

## 2018-01-11 LAB — CBC WITH DIFFERENTIAL/PLATELET
BASOS: 1 %
Basophils Absolute: 0.1 10*3/uL (ref 0.0–0.2)
EOS (ABSOLUTE): 0.7 10*3/uL — AB (ref 0.0–0.4)
EOS: 8 %
HEMATOCRIT: 40.2 % (ref 34.0–46.6)
Hemoglobin: 13.5 g/dL (ref 11.1–15.9)
IMMATURE GRANS (ABS): 0 10*3/uL (ref 0.0–0.1)
IMMATURE GRANULOCYTES: 0 %
LYMPHS: 36 %
Lymphocytes Absolute: 2.9 10*3/uL (ref 0.7–3.1)
MCH: 30.4 pg (ref 26.6–33.0)
MCHC: 33.6 g/dL (ref 31.5–35.7)
MCV: 91 fL (ref 79–97)
MONOS ABS: 0.7 10*3/uL (ref 0.1–0.9)
Monocytes: 9 %
NEUTROS PCT: 46 %
Neutrophils Absolute: 3.6 10*3/uL (ref 1.4–7.0)
PLATELETS: 288 10*3/uL (ref 150–450)
RBC: 4.44 x10E6/uL (ref 3.77–5.28)
RDW: 13.1 % (ref 12.3–15.4)
WBC: 8 10*3/uL (ref 3.4–10.8)

## 2018-01-11 LAB — COMPREHENSIVE METABOLIC PANEL
ALT: 19 IU/L (ref 0–32)
AST: 13 IU/L (ref 0–40)
Albumin/Globulin Ratio: 2 (ref 1.2–2.2)
Albumin: 4.2 g/dL (ref 3.6–4.8)
Alkaline Phosphatase: 93 IU/L (ref 39–117)
BUN/Creatinine Ratio: 10 — ABNORMAL LOW (ref 12–28)
BUN: 13 mg/dL (ref 8–27)
Bilirubin Total: <0.2 mg/dL (ref 0.0–1.2)
CALCIUM: 9.4 mg/dL (ref 8.7–10.3)
CO2: 24 mmol/L (ref 20–29)
CREATININE: 1.24 mg/dL — AB (ref 0.57–1.00)
Chloride: 105 mmol/L (ref 96–106)
GFR, EST AFRICAN AMERICAN: 52 mL/min/{1.73_m2} — AB (ref >59)
GFR, EST NON AFRICAN AMERICAN: 45 mL/min/{1.73_m2} — AB (ref >59)
Globulin, Total: 2.1 g/dL (ref 1.5–4.5)
Glucose: 102 mg/dL — ABNORMAL HIGH (ref 65–99)
Potassium: 4.4 mmol/L (ref 3.5–5.2)
Sodium: 141 mmol/L (ref 134–144)
TOTAL PROTEIN: 6.3 g/dL (ref 6.0–8.5)

## 2018-01-11 LAB — LIPID PANEL
CHOLESTEROL TOTAL: 173 mg/dL (ref 100–199)
Chol/HDL Ratio: 3.3 ratio (ref 0.0–4.4)
HDL: 52 mg/dL (ref >39)
LDL Calculated: 107 mg/dL — ABNORMAL HIGH (ref 0–99)
TRIGLYCERIDES: 69 mg/dL (ref 0–149)
VLDL CHOLESTEROL CAL: 14 mg/dL (ref 5–40)

## 2018-01-11 LAB — HEPATITIS C ANTIBODY: Hep C Virus Ab: <0.1 s/co ratio (ref 0.0–0.9)

## 2018-01-12 ENCOUNTER — Other Ambulatory Visit: Payer: Self-pay | Admitting: Family Medicine

## 2018-01-12 NOTE — Telephone Encounter (Signed)
[facility] is requesting to fill pt ipatropium. Please advise. kh

## 2018-03-07 ENCOUNTER — Ambulatory Visit (INDEPENDENT_AMBULATORY_CARE_PROVIDER_SITE_OTHER): Payer: Medicare Other | Admitting: Family Medicine

## 2018-03-07 VITALS — BP 120/70 | HR 67 | Temp 97.7°F | Resp 16 | Wt 144.4 lb

## 2018-03-07 DIAGNOSIS — J4531 Mild persistent asthma with (acute) exacerbation: Secondary | ICD-10-CM | POA: Diagnosis not present

## 2018-03-07 MED ORDER — LEVOFLOXACIN 500 MG PO TABS: 500.0000 mg | ORAL_TABLET | Freq: Every day | ORAL | 0 refills | Status: DC

## 2018-03-07 NOTE — Progress Notes (Signed)
   Subjective:    Patient ID: Jessica Townsend, adult    DOB: 03-16-1950, 68 y.o.   MRN: [id number]  HPI She started getting sick December 27 with coughing, wheezing, chills, shortness of breath and slight sore throat.  Also having some slight back discomfort but no earache or fever.  He is using his Advair regularly and has been using the DuoNeb every 4 hours to keep this under control.  Review of Systems     Objective:   Physical Exam Alert and in no distress. Tympanic membranes and canals are normal. Pharyngeal area is normal. Neck is supple without adenopathy or thyromegaly. Cardiac exam shows a regular sinus rhythm without murmurs or gallops. Lungs are clear to auscultation.        Assessment & Plan:  Mild persistent asthmatic bronchitis with acute exacerbation - Plan: levofloxacin (LEVAQUIN) 500 MG tablet Levaquin usually works the best for this patient and I will therefore give it to her.  She will continue on her present medication regimen.

## 2018-03-13 ENCOUNTER — Other Ambulatory Visit: Payer: Self-pay | Admitting: Family Medicine

## 2018-03-13 DIAGNOSIS — J449 Chronic obstructive pulmonary disease, unspecified: Secondary | ICD-10-CM

## 2018-03-13 DIAGNOSIS — J4530 Mild persistent asthma, uncomplicated: Secondary | ICD-10-CM

## 2018-03-13 NOTE — Telephone Encounter (Signed)
[facility] is requesting to fill pt symbicort. Please advise KH 

## 2018-03-16 ENCOUNTER — Telehealth: Payer: Self-pay

## 2018-03-16 NOTE — Telephone Encounter (Signed)
Called pt to ask if cologuard was going to be done. No answer LVM KH

## 2018-03-22 ENCOUNTER — Telehealth: Payer: Self-pay

## 2018-03-22 NOTE — Telephone Encounter (Signed)
Pt was called to advise a need for AWV appt. Jessica Townsend

## 2018-05-10 ENCOUNTER — Other Ambulatory Visit: Payer: Self-pay | Admitting: Family Medicine

## 2018-05-10 DIAGNOSIS — J449 Chronic obstructive pulmonary disease, unspecified: Secondary | ICD-10-CM

## 2018-05-10 DIAGNOSIS — J4530 Mild persistent asthma, uncomplicated: Secondary | ICD-10-CM

## 2018-07-18 IMAGING — CR DG CHEST 1V PORT
2 series · 2 of 2 positions shown · non-contrast
Comparison: 09/04/2016

CLINICAL DATA: Respiratory distress.

EXAM:
PORTABLE CHEST 1 VIEW

[AP (1 of 2)]
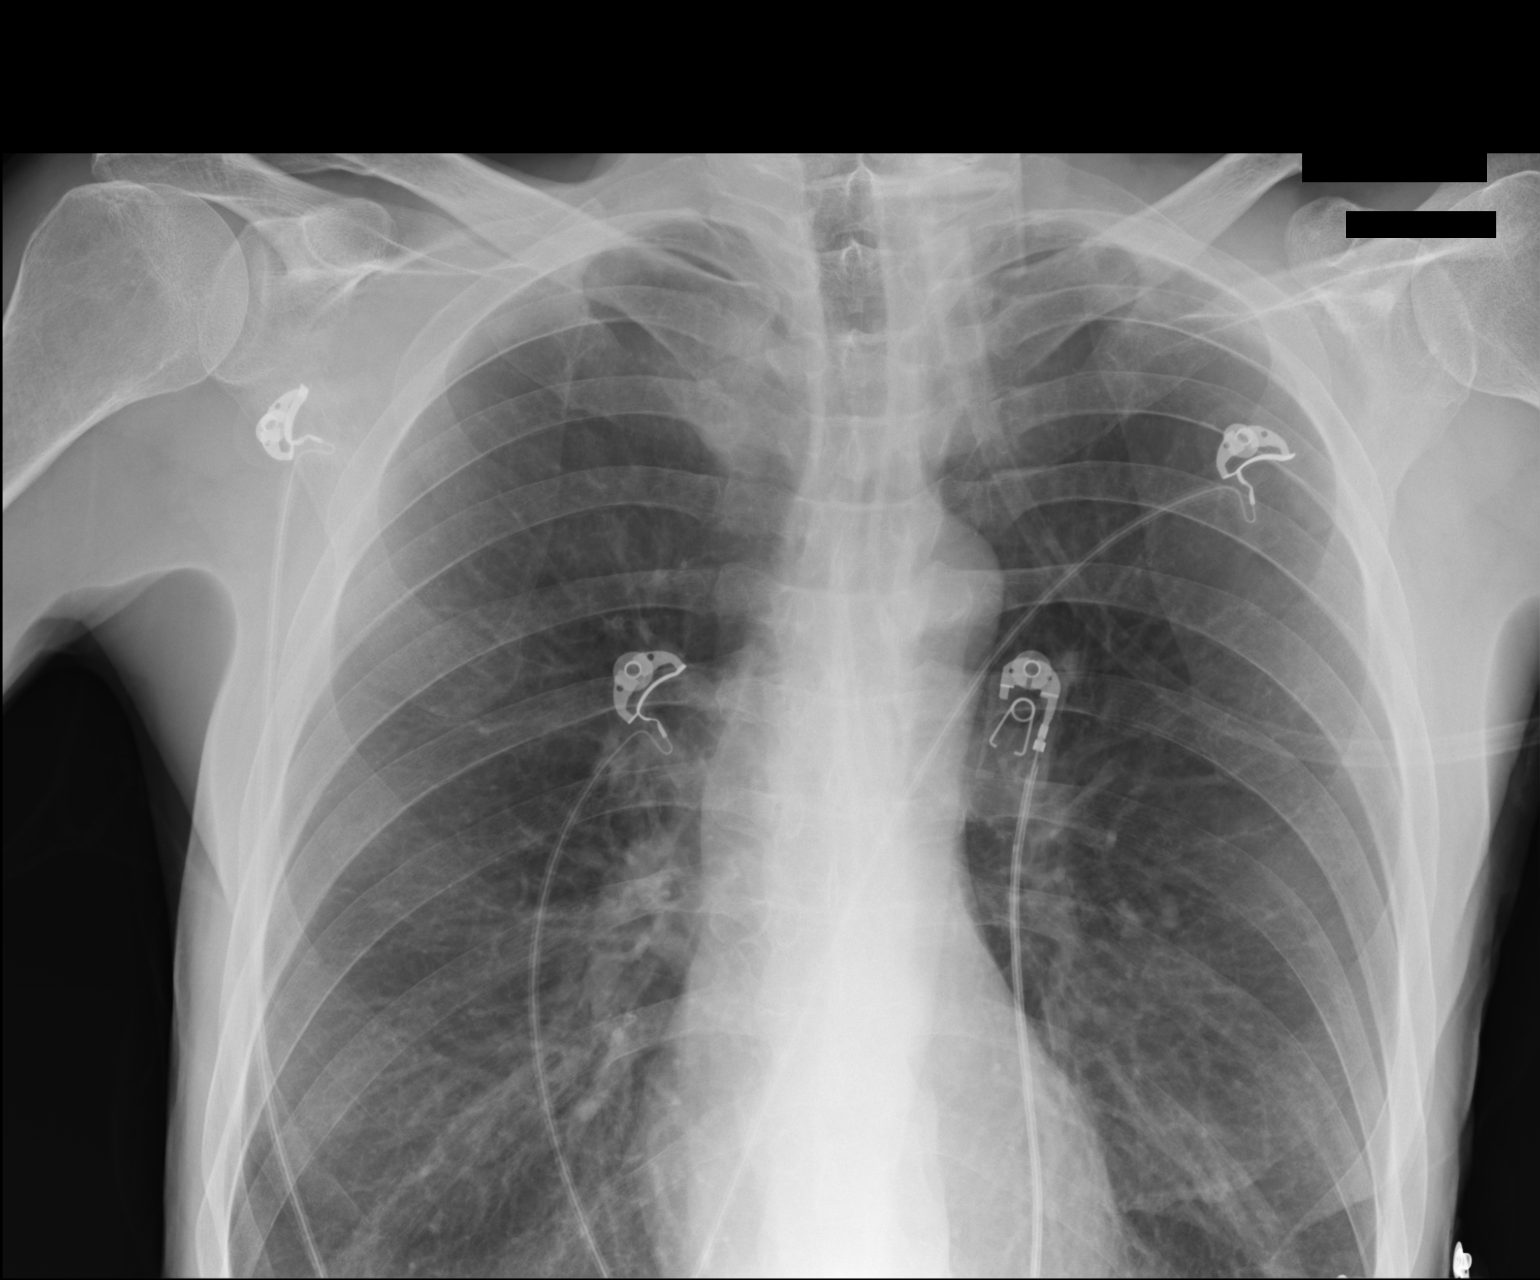

[AP (2 of 2)]
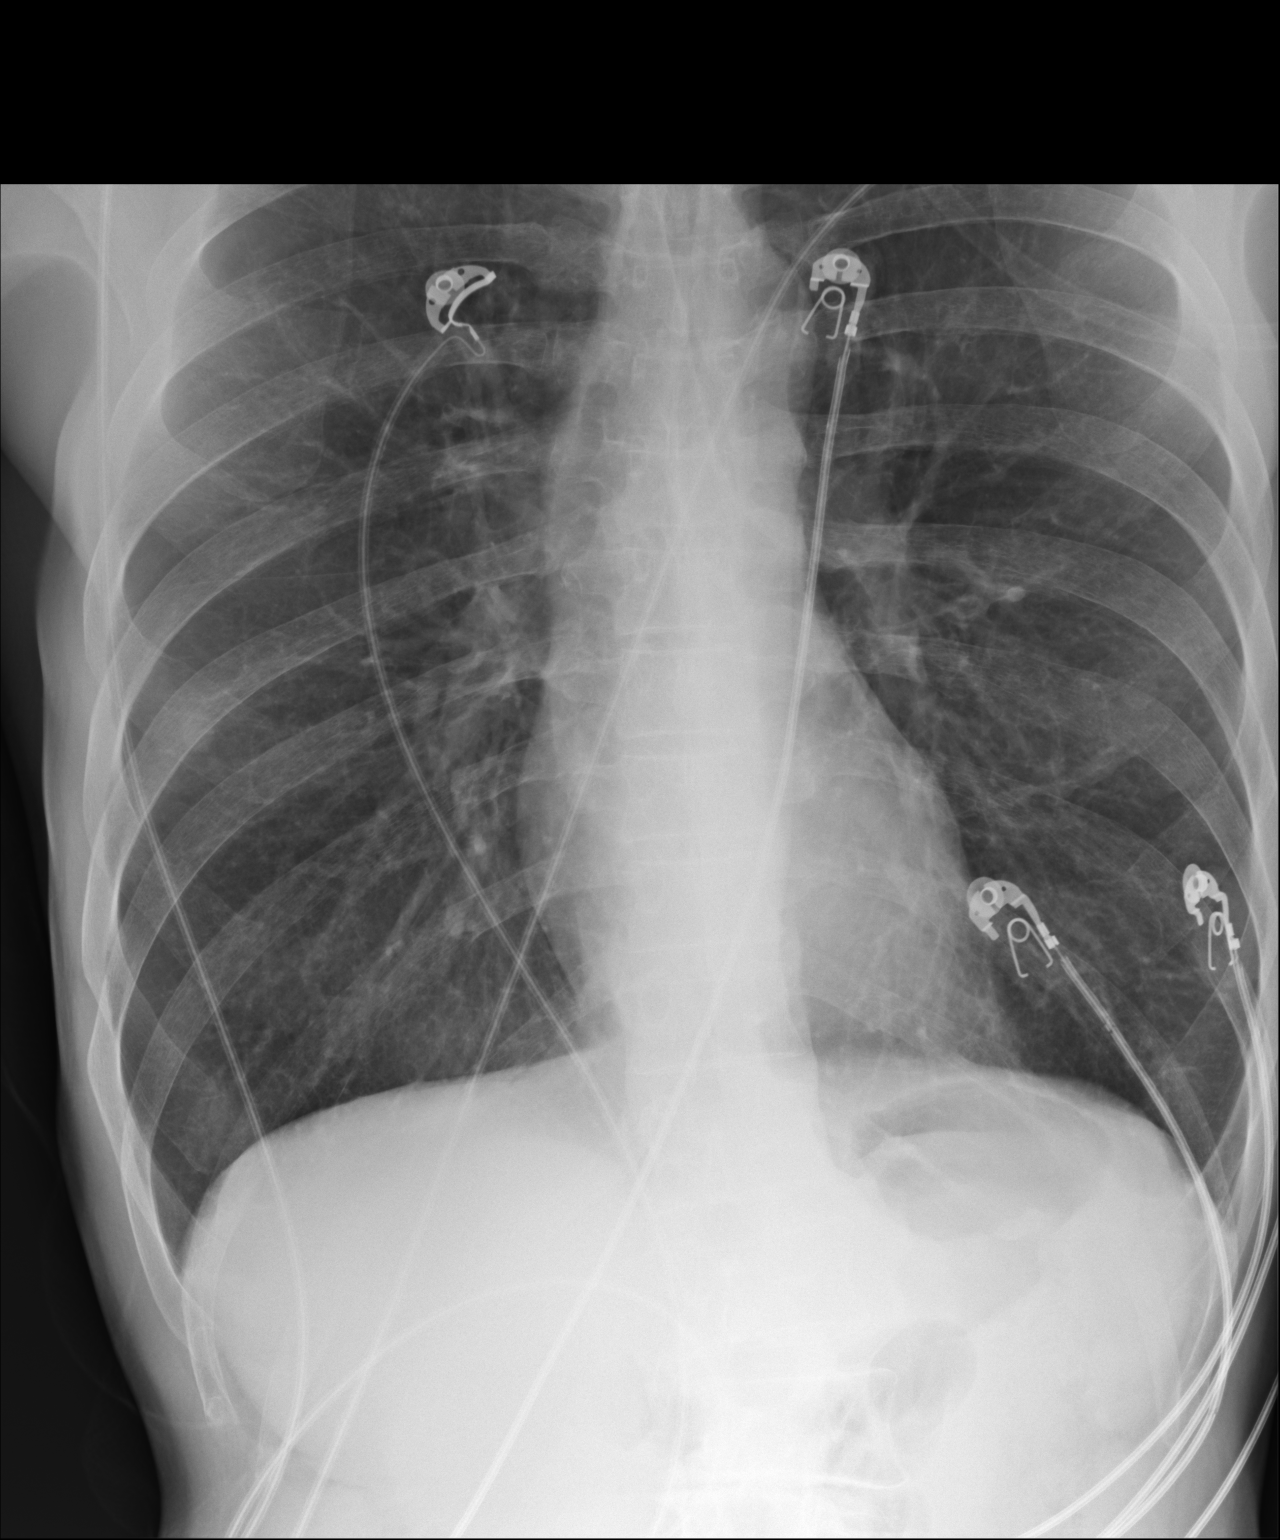

[2 of 2 positions shown; findings below may reference images not displayed]

FINDINGS: The heart size and mediastinal contours are within normal limits.
Stable hyperinflation and emphysematous lung disease. There is no
evidence of pulmonary edema, consolidation, pneumothorax, nodule or
pleural fluid. The visualized skeletal structures are unremarkable.
IMPRESSION: Stable emphysema.

## 2018-10-27 ENCOUNTER — Encounter: Payer: Self-pay | Admitting: Family Medicine

## 2018-11-12 ENCOUNTER — Other Ambulatory Visit: Payer: Self-pay | Admitting: Family Medicine

## 2018-11-12 DIAGNOSIS — J449 Chronic obstructive pulmonary disease, unspecified: Secondary | ICD-10-CM

## 2018-11-12 DIAGNOSIS — J4530 Mild persistent asthma, uncomplicated: Secondary | ICD-10-CM

## 2018-11-13 ENCOUNTER — Other Ambulatory Visit: Payer: Self-pay | Admitting: Family Medicine

## 2018-11-13 ENCOUNTER — Telehealth: Payer: Self-pay | Admitting: Family Medicine

## 2018-11-13 DIAGNOSIS — J449 Chronic obstructive pulmonary disease, unspecified: Secondary | ICD-10-CM

## 2018-11-13 DIAGNOSIS — J4530 Mild persistent asthma, uncomplicated: Secondary | ICD-10-CM

## 2018-11-13 NOTE — Telephone Encounter (Signed)
[facility] is requesting to fill pt ipratropium and symbicort. Please advise. [place]

## 2018-11-13 NOTE — Telephone Encounter (Signed)
She is on Medicare/Medicaid, how much does that cost?

## 2018-11-13 NOTE — Telephone Encounter (Signed)
Pt called needs Simbicort and Albuterol to [facility] on [address].  Advised pt she needs ov and she states she does not have money for that.  Please advise pt.

## 2018-11-13 NOTE — Telephone Encounter (Signed)
[facility] is requesting to fill pt ipratropium. Please advise [facility]

## 2018-11-14 NOTE — Telephone Encounter (Signed)
Called pt and advised with both insurances and yearly deductible should be met by now that she should not have a lot out of pocket.  Pt began telling me about how much she owes the hospital and why she cannot afford to come in.  I explained for Dr. Redmond School to care for her, he must be able to see her.  She said ok and set up Dec appt.

## 2019-01-11 ENCOUNTER — Other Ambulatory Visit: Payer: Self-pay | Admitting: Family Medicine

## 2019-01-11 DIAGNOSIS — Z1211 Encounter for screening for malignant neoplasm of colon: Secondary | ICD-10-CM

## 2019-01-18 ENCOUNTER — Telehealth: Payer: Self-pay | Admitting: Family Medicine

## 2019-01-18 NOTE — Telephone Encounter (Signed)
Pt called and stated that her colo guard screening kit will cost $700 to send in and was wondering if there was a cheaper alternative

## 2019-01-18 NOTE — Telephone Encounter (Signed)
This does not sound right.  I think she is covered under Medicare and Medicaid.

## 2019-01-22 NOTE — Telephone Encounter (Signed)
VM for pt to call back concerning the cologuard. [place]

## 2019-01-22 NOTE — Telephone Encounter (Signed)
Jessica Townsend I think this was supposed to go to you, I am not familiar with Cologuard coverage

## 2019-01-22 NOTE — Telephone Encounter (Signed)
Spoke to cologuard and was advise that she is covered at 100 %. Pt has meet all guide lines for this not to cost her a penny.

## 2019-01-22 NOTE — Telephone Encounter (Signed)
Make sure that Rip Harbour gets the Cologuard

## 2019-01-23 DIAGNOSIS — Z1211 Encounter for screening for malignant neoplasm of colon: Secondary | ICD-10-CM | POA: Diagnosis not present

## 2019-01-23 NOTE — Telephone Encounter (Signed)
LVM for pt to advise the cologuard is no cost to her .  2nd called [facility]

## 2019-01-24 LAB — COLOGUARD: Cologuard: NEGATIVE

## 2019-02-01 NOTE — Progress Notes (Signed)
LV for pt to call back to office for negative cologuard . [place]

## 2019-02-08 ENCOUNTER — Encounter: Payer: Self-pay | Admitting: Family Medicine

## 2019-02-08 ENCOUNTER — Other Ambulatory Visit: Payer: Self-pay

## 2019-02-08 ENCOUNTER — Ambulatory Visit (INDEPENDENT_AMBULATORY_CARE_PROVIDER_SITE_OTHER): Payer: Medicare Other | Admitting: Family Medicine

## 2019-02-08 VITALS — BP 116/80 | HR 74 | Temp 96.0°F | Wt 157.8 lb

## 2019-02-08 DIAGNOSIS — N1831 Chronic kidney disease, stage 3a: Secondary | ICD-10-CM | POA: Diagnosis not present

## 2019-02-08 DIAGNOSIS — Z23 Encounter for immunization: Secondary | ICD-10-CM

## 2019-02-08 DIAGNOSIS — F640 Transsexualism: Secondary | ICD-10-CM

## 2019-02-08 DIAGNOSIS — J449 Chronic obstructive pulmonary disease, unspecified: Secondary | ICD-10-CM | POA: Diagnosis not present

## 2019-02-08 DIAGNOSIS — Z789 Other specified health status: Secondary | ICD-10-CM

## 2019-02-08 DIAGNOSIS — Z720 Tobacco use: Secondary | ICD-10-CM

## 2019-02-08 LAB — POCT URINALYSIS DIP (PROADVANTAGE DEVICE)
Bilirubin, UA: NEGATIVE
Blood, UA: NEGATIVE
Glucose, UA: NEGATIVE mg/dL
Ketones, POC UA: NEGATIVE mg/dL
Leukocytes, UA: NEGATIVE
Nitrite, UA: NEGATIVE
Protein Ur, POC: NEGATIVE mg/dL
Specific Gravity, Urine: 1.015
Urobilinogen, Ur: 0.2
pH, UA: 6 (ref 5.0–8.0)

## 2019-02-08 NOTE — Patient Instructions (Addendum)
  Jessica Townsend , Thank you for taking time to come for your Medicare Wellness Visit. I appreciate your ongoing commitment to your health goals. Please review the following plan we discussed and let me know if I can assist you in the future.   These are the goals we discussed: When you are ready to quit smoking, let me know and I will be happy to work with you. This is a list of the screening recommended for you and due dates:  Health Maintenance  Topic Date Due  . Flu Shot  09/30/2018  . Cologuard (Stool DNA test)  01/23/2022  .  Hepatitis C: One time screening is recommended by Center for Disease Control  (CDC) for  adults born from [birth year] through 1964.   Completed  . Pneumonia vaccines  Completed  . Mammogram  Discontinued  . DEXA scan (bone density measurement)  Discontinued  . Tetanus Vaccine  Discontinued

## 2019-02-08 NOTE — Progress Notes (Signed)
Jessica Townsend is a 68 y.o. female who presents for annual wellness visit and follow-up on chronic medical conditions.  She has no particular concerns or complaints.  She continues on Symbicort regularly and also uses the DuoNeb usually in the morning.  Keeps her breathing under good control.  She still smokes and again is not interested in quitting.  Psychologically things are going quite well for her.  She has been sequestering because of Covid and seems to be handling this fairly well.   Immunizations and Health Maintenance Immunization History  Administered Date(s) Administered  . Influenza Split 11/04/2011  . Influenza,inj,Quad PF,6+ Mos 12/16/2015  . Pneumococcal Conjugate-13 01/10/2018  . Pneumococcal Polysaccharide-23 02/24/2010, 09/06/2016   Health Maintenance Due  Topic Date Due  . INFLUENZA VACCINE  09/30/2018    Last colonoscopy: cologuard  Last PSA: unknown Dentist: 7-8 years ago Ophtho: three years Exercise: walking   Other doctors caring for patient include:  N/A Advanced Directives: not on file  Does Patient Have a Medical Advance Directive?: Yes Type of Advance Directive: Healthcare Power of Attorney, Living will, Out of facility DNR (pink MOST or yellow form) Does patient want to make changes to medical advance directive?: No - Patient declined Copy of Healthcare Power of Attorney in Chart?: No - copy requested  Depression screen:  See questionnaire below.     Depression screen [facility] 2/9 02/08/2019 01/10/2018 12/20/2017 09/16/2016 02/19/2013  Decreased Interest 0 0 0 0 0  Down, Depressed, Hopeless 0 0 0 0 0  PHQ - 2 Score 0 0 0 0 0    Fall Screen: See Questionaire below.   Fall Risk  02/08/2019 01/10/2018 12/20/2017 09/16/2016  Falls in the past year? 0 0 No No    ADL screen:  See questionnaire below.  Functional Status Survey: Is the patient deaf or have difficulty hearing?: No Does the patient have difficulty seeing, even when wearing  glasses/contacts?: No Does the patient have difficulty concentrating, remembering, or making decisions?: No Does the patient have difficulty walking or climbing stairs?: No Does the patient have difficulty dressing or bathing?: No Does the patient have difficulty doing errands alone such as visiting a doctor's office or shopping?: No   Review of Systems  Constitutional: -, -unexpected weight change, -anorexia, -fatigue Allergy: -sneezing, -itching, -congestion Dermatology: denies changing moles, rash, lumps ENT: -runny nose, -ear pain, -sore throat,  Cardiology:  -chest pain, -palpitations, -orthopnea, Respiratory: -cough, -shortness of breath, -dyspnea on exertion, -wheezing,  Gastroenterology: -abdominal pain, -nausea, -vomiting, -diarrhea, -constipation, -dysphagia Hematology: -bleeding or bruising problems Musculoskeletal: -arthralgias, -myalgias, -joint swelling, -back pain, - Ophthalmology: -vision changes,  Urology: -dysuria, -difficulty urinating,  -urinary frequency, -urgency, incontinence Neurology: -, -numbness, , -memory loss, -falls, -dizziness    PHYSICAL EXAM:  BP 116/80 (BP Location: Left Arm, Patient Position: Sitting)   Pulse 74   Temp (!) 96 F (35.6 C)   Wt 157 lb 12.8 oz (71.6 kg)   SpO2 97%   BMI 20.82 kg/m   General Appearance: Alert, cooperative, no distress, appears stated age Head: Normocephalic, without obvious abnormality, atraumatic Eyes: PERRL, conjunctiva/corneas clear, EOM's intact,  Ears: Normal TM's and external ear canals Nose: Nares normal, mucosa normal, no drainage or sinus   tenderness Throat: Lips, mucosa, and tongue normal; teeth and gums normal Neck: Supple, no lymphadenopathy, thyroid:no enlargement/tenderness/nodules; no carotid bruit or JVD Lungs: Clear to auscultation bilaterally without wheezes, rales or ronchi; respirations unlabored Heart: Regular rate and rhythm, S1 and S2 normal, no murmur, rub  or gallop Abdomen: Soft,  non-tender, nondistended, normoactive bowel sounds, no masses, no hepatosplenomegaly Skin: Skin color, texture, turgor normal, no rashes or lesions Lymph nodes: Cervical, supraclavicular, and axillary nodes normal Neurologic: CNII-XII intact, normal strength, sensation and gait; reflexes 2+ and symmetric throughout   Psych: Normal mood, affect, hygiene and grooming  ASSESSMENT/PLAN: Chronic obstructive pulmonary disease, unspecified COPD type ([place]) - Plan: CBC with Differential, Comprehensive metabolic panel  Need for influenza vaccination - Plan: Flu Vaccine QUAD High Dose(Fluad)  Stage 3a chronic kidney disease - Plan: Comprehensive metabolic panel  Female-to-female transgender person - Plan: CBC with Differential, Comprehensive metabolic panel  Current occasional smoker  recommended at least 30 minutes of aerobic activity at least 5 days/week; proper sunscreen use reviewed; healthy diet and alcohol recommendations (less than or equal to 2 drinks/day) reviewed; regular seatbelt use; changing batteries in smoke detectors. Immunization recommendations discussed.  Colonoscopy recommendations reviewed.   Medicare Attestation I have personally reviewed: The patient's medical and social history Their use of alcohol, tobacco or illicit drugs Their current medications and supplements The patient's functional ability including ADLs,fall risks, home safety risks, cognitive, and hearing and visual impairment Diet and physical activities Evidence for depression or mood disorders  The patient's weight, height, and BMI have been recorded in the chart.  I have made referrals, counseling, and provided education to the patient based on review of the above and I have provided the patient with a written personalized care plan for preventive services.     [name], MD   02/08/2019

## 2019-02-09 LAB — COMPREHENSIVE METABOLIC PANEL
ALT: 16 IU/L (ref 0–32)
AST: 14 IU/L (ref 0–40)
Albumin/Globulin Ratio: 2.1 (ref 1.2–2.2)
Albumin: 4.4 g/dL (ref 3.8–4.8)
Alkaline Phosphatase: 86 IU/L (ref 39–117)
BUN/Creatinine Ratio: 10 — ABNORMAL LOW (ref 12–28)
BUN: 13 mg/dL (ref 8–27)
Bilirubin Total: 0.3 mg/dL (ref 0.0–1.2)
CO2: 26 mmol/L (ref 20–29)
Calcium: 9.5 mg/dL (ref 8.7–10.3)
Chloride: 105 mmol/L (ref 96–106)
Creatinine, Ser: 1.25 mg/dL — ABNORMAL HIGH (ref 0.57–1.00)
GFR calc Af Amer: 51 mL/min/{1.73_m2} — ABNORMAL LOW (ref >59)
GFR calc non Af Amer: 45 mL/min/{1.73_m2} — ABNORMAL LOW (ref >59)
Globulin, Total: 2.1 g/dL (ref 1.5–4.5)
Glucose: 97 mg/dL (ref 65–99)
Potassium: 4.3 mmol/L (ref 3.5–5.2)
Sodium: 141 mmol/L (ref 134–144)
Total Protein: 6.5 g/dL (ref 6.0–8.5)

## 2019-02-09 LAB — CBC WITH DIFFERENTIAL/PLATELET
Basophils Absolute: 0.1 10*3/uL (ref 0.0–0.2)
Basos: 1 %
EOS (ABSOLUTE): 0.6 10*3/uL — ABNORMAL HIGH (ref 0.0–0.4)
Eos: 7 %
Hematocrit: 40.9 % (ref 34.0–46.6)
Hemoglobin: 13.8 g/dL (ref 11.1–15.9)
Immature Grans (Abs): 0 10*3/uL (ref 0.0–0.1)
Immature Granulocytes: 0 %
Lymphocytes Absolute: 2.8 10*3/uL (ref 0.7–3.1)
Lymphs: 34 %
MCH: 31.4 pg (ref 26.6–33.0)
MCHC: 33.7 g/dL (ref 31.5–35.7)
MCV: 93 fL (ref 79–97)
Monocytes Absolute: 0.6 10*3/uL (ref 0.1–0.9)
Monocytes: 8 %
Neutrophils Absolute: 4.2 10*3/uL (ref 1.4–7.0)
Neutrophils: 50 %
Platelets: 291 10*3/uL (ref 150–450)
RBC: 4.4 x10E6/uL (ref 3.77–5.28)
RDW: 13.1 % (ref 11.7–15.4)
WBC: 8.4 10*3/uL (ref 3.4–10.8)

## 2019-05-03 ENCOUNTER — Other Ambulatory Visit: Payer: Self-pay

## 2019-05-03 NOTE — Telephone Encounter (Signed)
Check with Juliette Alcide on where she wants to get this.  She normally goes to a different pharmacy

## 2019-05-03 NOTE — Telephone Encounter (Signed)
Received a fax from [facility] stating the pt. Needs a refill on her albuterol for her nebulizer machine. Pt. Last apt was 02/08/19.

## 2019-05-03 NOTE — Telephone Encounter (Signed)
[facility] advised that they have a note not to share pt phone number . How ever the script in question is symbicort 160/4.5.  A letter will be mailed out to have pt advise office of new number . Please advise if anything else would be needed. KH

## 2019-05-03 NOTE — Telephone Encounter (Signed)
Check with [facility] and see if they have a phone number and also verify the prescription

## 2019-05-03 NOTE — Telephone Encounter (Signed)
See message below. KH 

## 2019-05-03 NOTE — Telephone Encounter (Signed)
I called the pt. To check to make sure that was the correct pharmacy and it said her phone number was no longer in service and that is the only [facility] number we have on file for her.

## 2019-05-04 MED ORDER — IPRATROPIUM-ALBUTEROL 0.5-2.5 (3) MG/3ML IN SOLN: RESPIRATORY_TRACT | 3 refills | Status: DC

## 2019-05-11 ENCOUNTER — Other Ambulatory Visit: Payer: Self-pay | Admitting: Internal Medicine

## 2019-05-11 MED ORDER — IPRATROPIUM-ALBUTEROL 0.5-2.5 (3) MG/3ML IN SOLN: RESPIRATORY_TRACT | 3 refills | Status: DC

## 2019-07-05 ENCOUNTER — Other Ambulatory Visit: Payer: Self-pay | Admitting: Family Medicine

## 2019-07-05 DIAGNOSIS — J4530 Mild persistent asthma, uncomplicated: Secondary | ICD-10-CM

## 2019-07-05 DIAGNOSIS — J449 Chronic obstructive pulmonary disease, unspecified: Secondary | ICD-10-CM

## 2019-07-05 NOTE — Telephone Encounter (Signed)
[facility] is requesting to fill pt symbicort. Please advise KH °

## 2020-02-12 ENCOUNTER — Other Ambulatory Visit: Payer: Self-pay

## 2020-02-12 ENCOUNTER — Ambulatory Visit (INDEPENDENT_AMBULATORY_CARE_PROVIDER_SITE_OTHER): Payer: Medicare Other | Admitting: Family Medicine

## 2020-02-12 ENCOUNTER — Encounter: Payer: Self-pay | Admitting: Family Medicine

## 2020-02-12 VITALS — BP 112/70 | HR 72 | Temp 96.5°F | Ht 72.5 in | Wt 156.6 lb

## 2020-02-12 DIAGNOSIS — Z13220 Encounter for screening for lipoid disorders: Secondary | ICD-10-CM

## 2020-02-12 DIAGNOSIS — J449 Chronic obstructive pulmonary disease, unspecified: Secondary | ICD-10-CM | POA: Diagnosis not present

## 2020-02-12 DIAGNOSIS — Z789 Other specified health status: Secondary | ICD-10-CM | POA: Diagnosis not present

## 2020-02-12 DIAGNOSIS — J45909 Unspecified asthma, uncomplicated: Secondary | ICD-10-CM | POA: Insufficient documentation

## 2020-02-12 DIAGNOSIS — J4530 Mild persistent asthma, uncomplicated: Secondary | ICD-10-CM | POA: Diagnosis not present

## 2020-02-12 DIAGNOSIS — N182 Chronic kidney disease, stage 2 (mild): Secondary | ICD-10-CM

## 2020-02-12 DIAGNOSIS — Z720 Tobacco use: Secondary | ICD-10-CM | POA: Diagnosis not present

## 2020-02-12 LAB — COMPREHENSIVE METABOLIC PANEL
ALT: 11 IU/L (ref 0–32)
AST: 12 IU/L (ref 0–40)
Albumin/Globulin Ratio: 1.9 (ref 1.2–2.2)
Albumin: 4.3 g/dL (ref 3.8–4.8)
Alkaline Phosphatase: 95 IU/L (ref 44–121)
BUN/Creatinine Ratio: 18 (ref 12–28)
BUN: 21 mg/dL (ref 8–27)
Bilirubin Total: <0.2 mg/dL (ref 0.0–1.2)
CO2: 22 mmol/L (ref 20–29)
Calcium: 9.3 mg/dL (ref 8.7–10.3)
Chloride: 107 mmol/L — ABNORMAL HIGH (ref 96–106)
Creatinine, Ser: 1.14 mg/dL — ABNORMAL HIGH (ref 0.57–1.00)
GFR calc Af Amer: 57 mL/min/{1.73_m2} — ABNORMAL LOW (ref >59)
GFR calc non Af Amer: 50 mL/min/{1.73_m2} — ABNORMAL LOW (ref >59)
Globulin, Total: 2.3 g/dL (ref 1.5–4.5)
Glucose: 85 mg/dL (ref 65–99)
Potassium: 4.5 mmol/L (ref 3.5–5.2)
Sodium: 142 mmol/L (ref 134–144)
Total Protein: 6.6 g/dL (ref 6.0–8.5)

## 2020-02-12 LAB — CBC WITH DIFFERENTIAL/PLATELET
Basophils Absolute: 0.2 10*3/uL (ref 0.0–0.2)
Basos: 2 %
EOS (ABSOLUTE): 0.7 10*3/uL — ABNORMAL HIGH (ref 0.0–0.4)
Eos: 7 %
Hematocrit: 41.9 % (ref 34.0–46.6)
Hemoglobin: 14 g/dL (ref 11.1–15.9)
Immature Grans (Abs): 0.1 10*3/uL (ref 0.0–0.1)
Immature Granulocytes: 1 %
Lymphocytes Absolute: 3 10*3/uL (ref 0.7–3.1)
Lymphs: 32 %
MCH: 31 pg (ref 26.6–33.0)
MCHC: 33.4 g/dL (ref 31.5–35.7)
MCV: 93 fL (ref 79–97)
Monocytes Absolute: 0.7 10*3/uL (ref 0.1–0.9)
Monocytes: 8 %
Neutrophils Absolute: 4.8 10*3/uL (ref 1.4–7.0)
Neutrophils: 50 %
Platelets: 387 10*3/uL (ref 150–450)
RBC: 4.52 x10E6/uL (ref 3.77–5.28)
RDW: 13.1 % (ref 11.7–15.4)
WBC: 9.4 10*3/uL (ref 3.4–10.8)

## 2020-02-12 LAB — LIPID PANEL
Chol/HDL Ratio: 4.7 ratio — ABNORMAL HIGH (ref 0.0–4.4)
Cholesterol, Total: 213 mg/dL — ABNORMAL HIGH (ref 100–199)
HDL: 45 mg/dL (ref >39)
LDL Chol Calc (NIH): 132 mg/dL — ABNORMAL HIGH (ref 0–99)
Triglycerides: 204 mg/dL — ABNORMAL HIGH (ref 0–149)
VLDL Cholesterol Cal: 36 mg/dL (ref 5–40)

## 2020-02-12 MED ORDER — BUDESONIDE-FORMOTEROL FUMARATE 160-4.5 MCG/ACT IN AERO: INHALATION_SPRAY | RESPIRATORY_TRACT | 3 refills | Status: DC

## 2020-02-12 MED ORDER — IPRATROPIUM-ALBUTEROL 0.5-2.5 (3) MG/3ML IN SOLN: RESPIRATORY_TRACT | 3 refills | Status: DC

## 2020-02-12 NOTE — Progress Notes (Signed)
Jessica Townsend is a 69 y.o. female who presents for annual wellness visit and follow-up on chronic medical conditions.  She is not on any hormone replacement mainly due to cost.  She continues to smoke with underlying COPD/asthma.  She is very stable on present medications.  Has no desire to quit smoking.  Does have laboratory evidence of CKD.  Is disabled due to the pulmonary conditions.  She has had vaccine but not the booster and at this point is not interested in getting it.  She relates vague complaints that she relates to getting the vaccine however there is no good documentation of this.   Immunizations and Health Maintenance Immunization History  Administered Date(s) Administered   Fluad Quad(high Dose 65+) 02/08/2019   Influenza Split 11/04/2011   Influenza,inj,Quad PF,6+ Mos 12/16/2015   Pneumococcal Conjugate-13 01/10/2018   Pneumococcal Polysaccharide-23 02/24/2010, 09/06/2016   Health Maintenance Due  Topic Date Due   COVID-19 Vaccine (1) Never done   INFLUENZA VACCINE  09/30/2019   Last colonoscopy: 01/24/19 cologuard Last PSA: N/A Dentist:  N/A Ophtho: Q year Exercise: walking 20 min a day   Other doctors caring for patient include: N/A  Advanced Directives: Does Patient Have a Medical Advance Directive?: Yes Type of Advance Directive: Living will,Out of facility DNR (pink MOST or yellow form) Would patient like information on creating a medical advance directive?: Yes (ED - Information included in AVS)  Depression screen:  See questionnaire below.     Depression screen [facility] 2/9 02/12/2020 02/08/2019 01/10/2018 12/20/2017 09/16/2016  Decreased Interest 0 0 0 0 0  Down, Depressed, Hopeless 0 0 0 0 0  PHQ - 2 Score 0 0 0 0 0    Fall Screen: See Questionaire below.   Fall Risk  02/12/2020 02/08/2019 01/10/2018 12/20/2017 09/16/2016  Falls in the past year? 0 0 0 No No    ADL screen:  See questionnaire below.  Functional Status Survey: Is the patient  deaf or have difficulty hearing?: No Does the patient have difficulty seeing, even when wearing glasses/contacts?: No Does the patient have difficulty concentrating, remembering, or making decisions?: No Does the patient have difficulty walking or climbing stairs?: No Does the patient have difficulty dressing or bathing?: No Does the patient have difficulty doing errands alone such as visiting a doctor's office or shopping?: No   Review of Systems  Constitutional: -, -unexpected weight change, -anorexia, -fatigue Allergy: -sneezing, -itching, -congestion Dermatology: denies changing moles, rash, lumps ENT: -runny nose, -ear pain, -sore throat,  Cardiology:  -chest pain, -palpitations, -orthopnea, Respiratory: -cough, -shortness of breath, -dyspnea on exertion, -wheezing,  Gastroenterology: -abdominal pain, -nausea, -vomiting, -diarrhea, -constipation, -dysphagia Hematology: -bleeding or bruising problems Musculoskeletal: -arthralgias, -myalgias, -joint swelling, -back pain, - Ophthalmology: -vision changes,  Urology: -dysuria, -difficulty urinating,  -urinary frequency, -urgency, incontinence Neurology: -, -numbness, , -memory loss, -falls, -dizziness    PHYSICAL EXAM:   General Appearance: Alert, cooperative, no distress, appears stated age Head: Normocephalic, without obvious abnormality, atraumatic Eyes: PERRL, conjunctiva/corneas clear, EOM's intact, Ears: Normal TM's and external ear canals Nose: Nares normal, mucosa normal, no drainage or sinus   tenderness Throat: Lips, mucosa, and tongue normal; teeth and gums normal Neck: Supple, no lymphadenopathy, thyroid:no enlargement/tenderness/nodules; no carotid bruit or JVD Lungs: Clear to auscultation bilaterally without wheezes, rales or ronchi; respirations unlabored Heart: Regular rate and rhythm, S1 and S2 normal, no murmur, rub or gallop Abdomen: Soft, non-tender, nondistended, normoactive bowel sounds, no masses, no  hepatosplenomegaly Extremities: No clubbing, cyanosis  or edema Skin: Skin color, texture, turgor normal, no rashes or lesions Lymph nodes: Cervical, supraclavicular, and axillary nodes normal Neurologic: CNII-XII intact, normal strength, sensation and gait; reflexes 2+ and symmetric throughout   Psych: Normal mood, affect, hygiene and grooming  ASSESSMENT/PLAN: Mild persistent asthmatic bronchitis without complication  Current occasional smoker  CKD (chronic kidney disease), stage II  Female-to-female transgender person  Chronic obstructive pulmonary disease, unspecified COPD type (HCC)    Immunization recommendations discussed.  She plans to get the flu shot and is not interested in getting the booster for Covid.  Colonoscopy recommendations reviewed.   Medicare Attestation I have personally reviewed: The patient's medical and social history Their use of alcohol, tobacco or illicit drugs Their current medications and supplements The patient's functional ability including ADLs,fall risks, home safety risks, cognitive, and hearing and visual impairment Diet and physical activities Evidence for depression or mood disorders  The patient's weight, height, and BMI have been recorded in the chart.  I have made referrals, counseling, and provided education to the patient based on review of the above and I have provided the patient with a written personalized care plan for preventive services.     Jessica Gowda, MD   02/12/2020

## 2020-02-15 ENCOUNTER — Telehealth: Payer: Self-pay

## 2020-02-15 NOTE — Telephone Encounter (Signed)
LVM advising pt that it will be ok for her to get a ped pfizer dose. Pt was also advised to clal back and schedule a nurse visit. KH

## 2020-02-19 ENCOUNTER — Other Ambulatory Visit: Payer: Self-pay

## 2020-02-19 ENCOUNTER — Ambulatory Visit (INDEPENDENT_AMBULATORY_CARE_PROVIDER_SITE_OTHER): Payer: Medicare Other

## 2020-02-19 DIAGNOSIS — Z23 Encounter for immunization: Secondary | ICD-10-CM | POA: Diagnosis not present

## 2020-11-28 ENCOUNTER — Encounter: Payer: Self-pay | Admitting: Family Medicine

## 2020-11-28 ENCOUNTER — Ambulatory Visit (INDEPENDENT_AMBULATORY_CARE_PROVIDER_SITE_OTHER): Payer: Medicare Other | Admitting: Family Medicine

## 2020-11-28 ENCOUNTER — Other Ambulatory Visit: Payer: Self-pay

## 2020-11-28 VITALS — BP 110/70 | HR 67 | Temp 96.2°F | Wt 149.8 lb

## 2020-11-28 DIAGNOSIS — Z720 Tobacco use: Secondary | ICD-10-CM | POA: Diagnosis not present

## 2020-11-28 DIAGNOSIS — J449 Chronic obstructive pulmonary disease, unspecified: Secondary | ICD-10-CM

## 2020-11-28 DIAGNOSIS — J4530 Mild persistent asthma, uncomplicated: Secondary | ICD-10-CM

## 2020-11-28 NOTE — Progress Notes (Signed)
   Subjective:    Patient ID: Jessica Townsend, adult    DOB: [birth year], 70 y.o.   MRN: [id number]  HPI She complains of a 1 week history of intermittent chest pressure that she describes as midline and is often either left or right side.  She states that she usually gets this when it is damp weather.  She describes it as tightness but no coughing.  Her pulse ox has been in the 97 range.  She has been using some Tylenol but states that Tylenol makes her sleepy.  She continues on Symbicort and has also been using albuterol usually twice per day.  She continues to smoke up to 10 cigarettes/day.  She has not had any COVID testing.   Review of Systems     Objective:   Physical Exam Alert and in no distress. Tympanic membranes and canals are normal. Pharyngeal area is normal. Neck is supple without adenopathy or thyromegaly. Cardiac exam shows a regular sinus rhythm without murmurs or gallops. Lungs are clear to auscultation.        Assessment & Plan:  Current occasional smoker  Mild persistent asthmatic bronchitis without complication  Chronic obstructive pulmonary disease, unspecified COPD type (HCC) At this point I see nothing to treat and recommend she continue to use Tylenol but on the lower dosing of 1 pill 4 times per day.  She was comfortable with that.

## 2021-02-13 ENCOUNTER — Other Ambulatory Visit: Payer: Self-pay | Admitting: Family Medicine

## 2021-02-13 DIAGNOSIS — J449 Chronic obstructive pulmonary disease, unspecified: Secondary | ICD-10-CM

## 2021-02-13 DIAGNOSIS — J4530 Mild persistent asthma, uncomplicated: Secondary | ICD-10-CM

## 2021-02-13 NOTE — Telephone Encounter (Signed)
[facility] is requesting to fill pt symbicort. Please advise [facility]

## 2021-03-04 ENCOUNTER — Telehealth: Payer: Self-pay | Admitting: Family Medicine

## 2021-03-04 NOTE — Telephone Encounter (Signed)
Spoke to patient to schedule Medicare Annual Wellness Visit (AWV) either virtually or in office.  Patient declined awv with [facility].  She stated she has appt 03/18/21 with pcp   Last AWV 02/12/20 please schedule at anytime with health coach  This should be a 45 minute visit.

## 2021-03-18 ENCOUNTER — Ambulatory Visit (INDEPENDENT_AMBULATORY_CARE_PROVIDER_SITE_OTHER): Payer: Medicare Other | Admitting: Family Medicine

## 2021-03-18 ENCOUNTER — Other Ambulatory Visit: Payer: Self-pay

## 2021-03-18 ENCOUNTER — Encounter: Payer: Self-pay | Admitting: Family Medicine

## 2021-03-18 VITALS — BP 112/70 | HR 63 | Temp 96.2°F | Resp 28 | Wt 151.8 lb

## 2021-03-18 DIAGNOSIS — Z136 Encounter for screening for cardiovascular disorders: Secondary | ICD-10-CM | POA: Diagnosis not present

## 2021-03-18 DIAGNOSIS — J449 Chronic obstructive pulmonary disease, unspecified: Secondary | ICD-10-CM | POA: Diagnosis not present

## 2021-03-18 DIAGNOSIS — Z8249 Family history of ischemic heart disease and other diseases of the circulatory system: Secondary | ICD-10-CM | POA: Diagnosis not present

## 2021-03-18 DIAGNOSIS — J4530 Mild persistent asthma, uncomplicated: Secondary | ICD-10-CM

## 2021-03-18 DIAGNOSIS — Z13220 Encounter for screening for lipoid disorders: Secondary | ICD-10-CM

## 2021-03-18 DIAGNOSIS — Z720 Tobacco use: Secondary | ICD-10-CM | POA: Diagnosis not present

## 2021-03-18 DIAGNOSIS — Z789 Other specified health status: Secondary | ICD-10-CM | POA: Diagnosis not present

## 2021-03-18 DIAGNOSIS — Z2821 Immunization not carried out because of patient refusal: Secondary | ICD-10-CM | POA: Diagnosis not present

## 2021-03-18 DIAGNOSIS — N182 Chronic kidney disease, stage 2 (mild): Secondary | ICD-10-CM | POA: Diagnosis not present

## 2021-03-18 NOTE — Progress Notes (Signed)
° °  Subjective:    Patient ID: Jessica Townsend, adult    DOB: Jan 13, 1951, 71 y.o.   MRN: [id number]  HPI Jessica Townsend is here for a med management visit.  She continues to smoke intermittently and is again refusing to stop.  She continues on Symbicort and does occasionally use DuoNeb.  She now states that she does have a family history of heart disease and that her father was in his 31s apparently died of a heart attack.  Review of record indicates need for Tdap and Shingrix shots.  She does not drink.  Presently is not on hormone replacement and has not been on them in several years.  In the past it has been a cost issue.  Review of record indicates early CKD.   Review of Systems     Objective:   Physical Exam Alert and in no distress. Tympanic membranes and canals are normal. Pharyngeal area is normal. Neck is supple without adenopathy or thyromegaly. Cardiac exam shows a regular sinus rhythm without murmurs or gallops. Lungs are clear to auscultation.        Assessment & Plan:  Family history of heart disease in female family member before age 14 - Plan: Lipid panel  Immunization refused  Mild persistent asthmatic bronchitis without complication - Plan: CBC with Differential/Platelet, Comprehensive metabolic panel  CKD (chronic kidney disease), stage II  Current occasional smoker  Female-to-female transgender person  Chronic obstructive pulmonary disease, unspecified COPD type ([place])  Screening for lipid disorders - Plan: Lipid panel She did not want any immunizations.  Also discussed the need for placing her on a statin drug and again not interested.  Meds will be renewed when she runs out.

## 2021-03-18 NOTE — Patient Instructions (Signed)
Tdap and a Shingrix at the drug store

## 2021-03-19 LAB — COMPREHENSIVE METABOLIC PANEL
ALT: 14 IU/L (ref 0–32)
AST: 17 IU/L (ref 0–40)
Albumin/Globulin Ratio: 2 (ref 1.2–2.2)
Albumin: 4.4 g/dL (ref 3.8–4.8)
Alkaline Phosphatase: 86 IU/L (ref 44–121)
BUN/Creatinine Ratio: 13 (ref 12–28)
BUN: 17 mg/dL (ref 8–27)
Bilirubin Total: 0.3 mg/dL (ref 0.0–1.2)
CO2: 25 mmol/L (ref 20–29)
Calcium: 9.6 mg/dL (ref 8.7–10.3)
Chloride: 103 mmol/L (ref 96–106)
Creatinine, Ser: 1.27 mg/dL — ABNORMAL HIGH (ref 0.57–1.00)
Globulin, Total: 2.2 g/dL (ref 1.5–4.5)
Glucose: 91 mg/dL (ref 70–99)
Potassium: 4.9 mmol/L (ref 3.5–5.2)
Sodium: 142 mmol/L (ref 134–144)
Total Protein: 6.6 g/dL (ref 6.0–8.5)
eGFR: 46 mL/min/{1.73_m2} — ABNORMAL LOW (ref >59)

## 2021-03-19 LAB — LIPID PANEL
Chol/HDL Ratio: 4.5 ratio — ABNORMAL HIGH (ref 0.0–4.4)
Cholesterol, Total: 205 mg/dL — ABNORMAL HIGH (ref 100–199)
HDL: 46 mg/dL (ref >39)
LDL Chol Calc (NIH): 130 mg/dL — ABNORMAL HIGH (ref 0–99)
Triglycerides: 162 mg/dL — ABNORMAL HIGH (ref 0–149)
VLDL Cholesterol Cal: 29 mg/dL (ref 5–40)

## 2021-03-19 LAB — CBC WITH DIFFERENTIAL/PLATELET
Basophils Absolute: 0.1 10*3/uL (ref 0.0–0.2)
Basos: 2 %
EOS (ABSOLUTE): 0.4 10*3/uL (ref 0.0–0.4)
Eos: 6 %
Hematocrit: 42.4 % (ref 34.0–46.6)
Hemoglobin: 14 g/dL (ref 11.1–15.9)
Immature Grans (Abs): 0.1 10*3/uL (ref 0.0–0.1)
Immature Granulocytes: 1 %
Lymphocytes Absolute: 3 10*3/uL (ref 0.7–3.1)
Lymphs: 39 %
MCH: 31.3 pg (ref 26.6–33.0)
MCHC: 33 g/dL (ref 31.5–35.7)
MCV: 95 fL (ref 79–97)
Monocytes Absolute: 0.7 10*3/uL (ref 0.1–0.9)
Monocytes: 9 %
Neutrophils Absolute: 3.4 10*3/uL (ref 1.4–7.0)
Neutrophils: 43 %
Platelets: 323 10*3/uL (ref 150–450)
RBC: 4.47 x10E6/uL (ref 3.77–5.28)
RDW: 13.1 % (ref 11.7–15.4)
WBC: 7.7 10*3/uL (ref 3.4–10.8)

## 2021-04-02 ENCOUNTER — Other Ambulatory Visit: Payer: Self-pay | Admitting: Family Medicine

## 2021-04-02 DIAGNOSIS — J449 Chronic obstructive pulmonary disease, unspecified: Secondary | ICD-10-CM

## 2021-04-02 DIAGNOSIS — J4530 Mild persistent asthma, uncomplicated: Secondary | ICD-10-CM

## 2021-04-03 ENCOUNTER — Telehealth: Payer: Self-pay

## 2021-04-03 MED ORDER — ALBUTEROL SULFATE HFA 108 (90 BASE) MCG/ACT IN AERS: 2.0000 | INHALATION_SPRAY | Freq: Four times a day (QID) | RESPIRATORY_TRACT | 1 refills | Status: DC | PRN

## 2021-04-03 NOTE — Telephone Encounter (Signed)
Received fax from [facility] to let you know that the pts. Nebulizer solution is on a long term back order. They wanted to know if you wanted to change the RX to something else.

## 2021-04-15 ENCOUNTER — Telehealth: Payer: Self-pay | Admitting: Family Medicine

## 2021-04-15 ENCOUNTER — Other Ambulatory Visit: Payer: Self-pay

## 2021-04-15 ENCOUNTER — Other Ambulatory Visit: Payer: Self-pay | Admitting: Family Medicine

## 2021-04-15 DIAGNOSIS — J4530 Mild persistent asthma, uncomplicated: Secondary | ICD-10-CM

## 2021-04-15 DIAGNOSIS — J449 Chronic obstructive pulmonary disease, unspecified: Secondary | ICD-10-CM

## 2021-04-15 MED ORDER — IPRATROPIUM-ALBUTEROL 0.5-2.5 (3) MG/3ML IN SOLN: RESPIRATORY_TRACT | 0 refills | Status: DC

## 2021-04-15 MED ORDER — ALBUTEROL SULFATE HFA 108 (90 BASE) MCG/ACT IN AERS: 2.0000 | INHALATION_SPRAY | Freq: Four times a day (QID) | RESPIRATORY_TRACT | 1 refills | Status: DC | PRN

## 2021-04-15 NOTE — Telephone Encounter (Signed)
Done KH. Just had to switch pharmacy. Kh

## 2021-04-15 NOTE — Telephone Encounter (Signed)
[facility] on [address] called and needs Ipratropium solution switched to them instead of [facility]

## 2021-04-21 ENCOUNTER — Telehealth: Payer: Self-pay

## 2021-04-21 NOTE — Telephone Encounter (Signed)
Called pt to find out if she  needs to have her ipratropi/albu solution filled. No answer LVM. KH

## 2021-07-06 ENCOUNTER — Other Ambulatory Visit: Payer: Self-pay | Admitting: Family Medicine

## 2021-07-06 DIAGNOSIS — J449 Chronic obstructive pulmonary disease, unspecified: Secondary | ICD-10-CM

## 2021-07-06 DIAGNOSIS — J4530 Mild persistent asthma, uncomplicated: Secondary | ICD-10-CM

## 2021-07-06 NOTE — Telephone Encounter (Signed)
[facility] is requesting to fill pt ipratropium and albuterol sol. Please advise [place] ?

## 2021-09-30 ENCOUNTER — Other Ambulatory Visit: Payer: Self-pay | Admitting: Family Medicine

## 2021-09-30 ENCOUNTER — Other Ambulatory Visit: Payer: Self-pay | Admitting: Medical

## 2021-09-30 DIAGNOSIS — J4530 Mild persistent asthma, uncomplicated: Secondary | ICD-10-CM

## 2021-09-30 DIAGNOSIS — J449 Chronic obstructive pulmonary disease, unspecified: Secondary | ICD-10-CM

## 2021-09-30 NOTE — Telephone Encounter (Signed)
Is this okay to refill? 

## 2022-01-01 ENCOUNTER — Telehealth: Payer: Self-pay | Admitting: Family Medicine

## 2022-01-01 DIAGNOSIS — J4530 Mild persistent asthma, uncomplicated: Secondary | ICD-10-CM

## 2022-01-01 DIAGNOSIS — J449 Chronic obstructive pulmonary disease, unspecified: Secondary | ICD-10-CM

## 2022-01-01 MED ORDER — IPRATROPIUM-ALBUTEROL 0.5-2.5 (3) MG/3ML IN SOLN: 3.0000 mL | Freq: Four times a day (QID) | RESPIRATORY_TRACT | 2 refills | Status: DC | PRN

## 2022-01-01 NOTE — Telephone Encounter (Signed)
Pt left message that they need albuterol solution refilled to [facility]

## 2022-03-02 ENCOUNTER — Other Ambulatory Visit: Payer: Self-pay | Admitting: Family Medicine

## 2022-03-02 DIAGNOSIS — J449 Chronic obstructive pulmonary disease, unspecified: Secondary | ICD-10-CM

## 2022-03-02 DIAGNOSIS — J4530 Mild persistent asthma, uncomplicated: Secondary | ICD-10-CM

## 2022-03-02 NOTE — Telephone Encounter (Signed)
Refill request last apt 03/18/21.

## 2022-09-03 ENCOUNTER — Other Ambulatory Visit: Payer: Self-pay | Admitting: Family Medicine

## 2022-09-03 DIAGNOSIS — J4530 Mild persistent asthma, uncomplicated: Secondary | ICD-10-CM

## 2022-09-03 DIAGNOSIS — J449 Chronic obstructive pulmonary disease, unspecified: Secondary | ICD-10-CM

## 2022-09-03 NOTE — Telephone Encounter (Signed)
Pt is scheduled for next week to come in. I have refill his medication to get him to his appointment

## 2022-09-03 NOTE — Telephone Encounter (Signed)
Is this okay to refill? 

## 2022-09-03 NOTE — Telephone Encounter (Signed)
Pt was called to schedule for an appointment and said it was too hot for him and he has trouble breathing in this weather so he said once it cools off he will schedule

## 2022-09-08 ENCOUNTER — Encounter: Payer: Self-pay | Admitting: Family Medicine

## 2022-09-08 ENCOUNTER — Ambulatory Visit (INDEPENDENT_AMBULATORY_CARE_PROVIDER_SITE_OTHER): Payer: Medicare Other | Admitting: Family Medicine

## 2022-09-08 VITALS — BP 136/88 | HR 77 | Temp 98.2°F | Resp 24 | Ht 73.0 in | Wt 157.4 lb

## 2022-09-08 DIAGNOSIS — J449 Chronic obstructive pulmonary disease, unspecified: Secondary | ICD-10-CM | POA: Diagnosis not present

## 2022-09-08 DIAGNOSIS — Z1211 Encounter for screening for malignant neoplasm of colon: Secondary | ICD-10-CM

## 2022-09-08 DIAGNOSIS — Z13220 Encounter for screening for lipoid disorders: Secondary | ICD-10-CM

## 2022-09-08 DIAGNOSIS — Z720 Tobacco use: Secondary | ICD-10-CM

## 2022-09-08 DIAGNOSIS — Z789 Other specified health status: Secondary | ICD-10-CM | POA: Diagnosis not present

## 2022-09-08 DIAGNOSIS — N182 Chronic kidney disease, stage 2 (mild): Secondary | ICD-10-CM

## 2022-09-08 DIAGNOSIS — J4530 Mild persistent asthma, uncomplicated: Secondary | ICD-10-CM | POA: Diagnosis not present

## 2022-09-08 MED ORDER — SYMBICORT 160-4.5 MCG/ACT IN AERO
2.0000 | INHALATION_SPRAY | Freq: Two times a day (BID) | RESPIRATORY_TRACT | 3 refills | Status: AC
Start: 2022-09-08 — End: ?

## 2022-09-08 MED ORDER — IPRATROPIUM-ALBUTEROL 0.5-2.5 (3) MG/3ML IN SOLN: 3.0000 mL | Freq: Four times a day (QID) | RESPIRATORY_TRACT | 5 refills | Status: DC | PRN

## 2022-09-08 NOTE — Progress Notes (Signed)
   Subjective:    Patient ID: Jessica Townsend, adult    DOB: January 19, 1951, 72 y.o.   MRN: [id number]  HPI She is here for a med check.  She has not been on hormone replacement in quite some time.  Continues on Symbicort as well as DuoNeb and occasional use of albuterol.  She continues to smoke but states that she has cut back on that.  She has no other concerns or complaints.  Not drink.  Her pulmonary disability keeps her from being as physically active as she would like.     Review of Systems  All other systems reviewed and are negative.      Objective:    Physical Exam Alert and in no distress. Tympanic membranes and canals are normal. Pharyngeal area is normal. Neck is supple without adenopathy or thyromegaly. Cardiac exam shows a regular sinus rhythm without murmurs or gallops. Lungs are clear to auscultation.  Did recommend getting Shingrix, RSV, Tdap and in the fall with flu and COVID shots.  Also discussed Cologuard and I will order that.

## 2022-09-09 LAB — COMPREHENSIVE METABOLIC PANEL
ALT: 21 IU/L (ref 0–32)
AST: 18 IU/L (ref 0–40)
Albumin: 4.1 g/dL (ref 3.8–4.8)
Alkaline Phosphatase: 111 IU/L (ref 44–121)
BUN/Creatinine Ratio: 16 (ref 12–28)
BUN: 20 mg/dL (ref 8–27)
Bilirubin Total: 0.3 mg/dL (ref 0.0–1.2)
CO2: 20 mmol/L (ref 20–29)
Calcium: 9.8 mg/dL (ref 8.7–10.3)
Chloride: 101 mmol/L (ref 96–106)
Creatinine, Ser: 1.24 mg/dL — ABNORMAL HIGH (ref 0.57–1.00)
Globulin, Total: 2.7 g/dL (ref 1.5–4.5)
Glucose: 104 mg/dL — ABNORMAL HIGH (ref 70–99)
Potassium: 4.8 mmol/L (ref 3.5–5.2)
Sodium: 137 mmol/L (ref 134–144)
Total Protein: 6.8 g/dL (ref 6.0–8.5)
eGFR: 47 mL/min/{1.73_m2} — ABNORMAL LOW (ref >59)

## 2022-09-09 LAB — CBC WITH DIFFERENTIAL/PLATELET
Basophils Absolute: 0.1 10*3/uL (ref 0.0–0.2)
Basos: 1 %
EOS (ABSOLUTE): 0.4 10*3/uL (ref 0.0–0.4)
Eos: 3 %
Hematocrit: 44.1 % (ref 34.0–46.6)
Hemoglobin: 14.6 g/dL (ref 11.1–15.9)
Immature Grans (Abs): 0.1 10*3/uL (ref 0.0–0.1)
Immature Granulocytes: 1 %
Lymphocytes Absolute: 1.8 10*3/uL (ref 0.7–3.1)
Lymphs: 16 %
MCH: 31.9 pg (ref 26.6–33.0)
MCHC: 33.1 g/dL (ref 31.5–35.7)
MCV: 97 fL (ref 79–97)
Monocytes Absolute: 0.5 10*3/uL (ref 0.1–0.9)
Monocytes: 4 %
Neutrophils Absolute: 8.6 10*3/uL — ABNORMAL HIGH (ref 1.4–7.0)
Neutrophils: 75 %
Platelets: 544 10*3/uL — ABNORMAL HIGH (ref 150–450)
RBC: 4.57 x10E6/uL (ref 3.77–5.28)
RDW: 12.5 % (ref 11.7–15.4)
WBC: 11.4 10*3/uL — ABNORMAL HIGH (ref 3.4–10.8)

## 2022-09-09 LAB — LIPID PANEL
Chol/HDL Ratio: 4 ratio (ref 0.0–4.4)
Cholesterol, Total: 242 mg/dL — ABNORMAL HIGH (ref 100–199)
HDL: 60 mg/dL (ref >39)
LDL Chol Calc (NIH): 158 mg/dL — ABNORMAL HIGH (ref 0–99)
Triglycerides: 132 mg/dL (ref 0–149)
VLDL Cholesterol Cal: 24 mg/dL (ref 5–40)

## 2022-09-09 MED ORDER — ATORVASTATIN CALCIUM 20 MG PO TABS: 20.0000 mg | ORAL_TABLET | Freq: Every day | ORAL | 3 refills | Status: AC

## 2022-09-09 NOTE — Addendum Note (Signed)
Addended by: [name] on: 09/09/2022 01:24 PM   Modules accepted: Orders

## 2022-09-14 DIAGNOSIS — Z1211 Encounter for screening for malignant neoplasm of colon: Secondary | ICD-10-CM | POA: Diagnosis not present

## 2022-09-23 ENCOUNTER — Telehealth: Payer: Self-pay | Admitting: Family Medicine

## 2022-09-23 NOTE — Telephone Encounter (Signed)
Started lipitor on 7/11 and feels he is having side effects of lipitor  Started 3 days ago, dizzy spells, abdominal cramping, feet are cold and clamy Feels like has fever but no fever, ringing in ears   Pt asking what she needs to do

## 2022-09-23 NOTE — Telephone Encounter (Signed)
Patient advised and verbalized understanding 

## 2022-11-05 ENCOUNTER — Telehealth: Payer: Self-pay | Admitting: Family Medicine

## 2022-11-05 DIAGNOSIS — J449 Chronic obstructive pulmonary disease, unspecified: Secondary | ICD-10-CM

## 2022-11-05 DIAGNOSIS — J4530 Mild persistent asthma, uncomplicated: Secondary | ICD-10-CM

## 2022-11-05 MED ORDER — IPRATROPIUM-ALBUTEROL 0.5-2.5 (3) MG/3ML IN SOLN
3.0000 mL | Freq: Four times a day (QID) | RESPIRATORY_TRACT | 0 refills | Status: AC | PRN
Start: 2022-11-05 — End: ?

## 2022-11-05 NOTE — Telephone Encounter (Signed)
Pt left a very inappropriate message yesterday regarding Dr. Susann Givens not refilling her medications at her appt in July, I checked & he did refill meds.  I called pharmacy to see if there were issues & Symbicort was out for delivery & the only issue is pt needs refills on the Duoneb, the quantity dispensed should be 360 ml for a 1 month supply instead of 30 ml per the pharmacist, so she has no refills left

## 2022-11-08 ENCOUNTER — Telehealth: Payer: Self-pay | Admitting: Internal Medicine

## 2022-11-08 MED ORDER — ALBUTEROL SULFATE HFA 108 (90 BASE) MCG/ACT IN AERS: 2.0000 | INHALATION_SPRAY | Freq: Four times a day (QID) | RESPIRATORY_TRACT | 1 refills | Status: AC | PRN

## 2022-11-08 NOTE — Telephone Encounter (Signed)
I called and spoke to patient about the voicemail that was left on Thursday that it was not being respectful to our MD and that it was not appropriate to talk the way he did and we do not tolerate that in our office. she states that she can say whatever she wants under the federal law as she was not threating anyone. sHe states that she wanted her albuterol inhaler refilled for over 27 days and why were we, just now going to refill it. I advised pt that it was the Symbicort inhaler that she requested not the albuterol inhaler but it was filled back in July for her visit. I advised her that we could refill the albuterol inhaler today as it was an overlook and that she could of called [place] if she didn't have it and needed it before now. She said, it was our issue not hers. She continued to raise her voice and talk over me and tell me that we do not know how to do our job right as it had on his paperwork that all meds in July was sent in. She kept on fussing talking about the federal law and she could do what she wanted and talk to [place] in any way she wanted. I then accidentally called her a sir in talking trying to calm patient down and she yelled back at me saying she was a female and that she had his D!!! cut off and did I want to see it on paperwork and then she hung up.   I believe at this point, she should be dismissed from the practice due to behavior.

## 2022-11-09 ENCOUNTER — Encounter: Payer: Self-pay | Admitting: Family Medicine

## 2022-11-09 NOTE — Telephone Encounter (Signed)
Dismissal sent 

## 2022-11-09 NOTE — Telephone Encounter (Signed)
Patient called and left a separate message on my voice mail with inappropriate language, including demading Dr. Susann Givens get off his and refill her meds.  Per Dr Susann Givens dismissal letter will be sent today.

## 2022-11-19 DIAGNOSIS — Z79899 Other long term (current) drug therapy: Secondary | ICD-10-CM | POA: Diagnosis not present

## 2022-11-19 DIAGNOSIS — J45909 Unspecified asthma, uncomplicated: Secondary | ICD-10-CM | POA: Diagnosis not present

## 2022-11-19 DIAGNOSIS — F17210 Nicotine dependence, cigarettes, uncomplicated: Secondary | ICD-10-CM | POA: Diagnosis not present

## 2022-11-19 DIAGNOSIS — N183 Chronic kidney disease, stage 3 unspecified: Secondary | ICD-10-CM | POA: Diagnosis not present

## 2022-11-19 DIAGNOSIS — Z0000 Encounter for general adult medical examination without abnormal findings: Secondary | ICD-10-CM | POA: Diagnosis not present

## 2022-11-19 DIAGNOSIS — E785 Hyperlipidemia, unspecified: Secondary | ICD-10-CM | POA: Diagnosis not present

## 2022-11-26 DIAGNOSIS — Z0001 Encounter for general adult medical examination with abnormal findings: Secondary | ICD-10-CM | POA: Diagnosis not present

## 2022-11-26 DIAGNOSIS — J449 Chronic obstructive pulmonary disease, unspecified: Secondary | ICD-10-CM | POA: Diagnosis not present

## 2022-11-26 DIAGNOSIS — R7989 Other specified abnormal findings of blood chemistry: Secondary | ICD-10-CM | POA: Diagnosis not present

## 2022-11-26 DIAGNOSIS — E785 Hyperlipidemia, unspecified: Secondary | ICD-10-CM | POA: Diagnosis not present

## 2022-11-26 DIAGNOSIS — R7303 Prediabetes: Secondary | ICD-10-CM | POA: Diagnosis not present

## 2022-11-26 DIAGNOSIS — J4540 Moderate persistent asthma, uncomplicated: Secondary | ICD-10-CM | POA: Diagnosis not present

## 2022-11-26 DIAGNOSIS — N183 Chronic kidney disease, stage 3 unspecified: Secondary | ICD-10-CM | POA: Diagnosis not present

## 2022-11-26 DIAGNOSIS — E46 Unspecified protein-calorie malnutrition: Secondary | ICD-10-CM | POA: Diagnosis not present

## 2023-01-17 DIAGNOSIS — J441 Chronic obstructive pulmonary disease with (acute) exacerbation: Secondary | ICD-10-CM | POA: Diagnosis not present

## 2023-01-17 DIAGNOSIS — E46 Unspecified protein-calorie malnutrition: Secondary | ICD-10-CM | POA: Diagnosis not present

## 2023-01-17 DIAGNOSIS — E785 Hyperlipidemia, unspecified: Secondary | ICD-10-CM | POA: Diagnosis not present

## 2023-01-17 DIAGNOSIS — J019 Acute sinusitis, unspecified: Secondary | ICD-10-CM | POA: Diagnosis not present

## 2023-01-17 DIAGNOSIS — Z79899 Other long term (current) drug therapy: Secondary | ICD-10-CM | POA: Diagnosis not present

## 2023-01-17 DIAGNOSIS — M7918 Myalgia, other site: Secondary | ICD-10-CM | POA: Diagnosis not present

## 2023-02-01 DIAGNOSIS — M79672 Pain in left foot: Secondary | ICD-10-CM | POA: Diagnosis not present

## 2023-03-17 ENCOUNTER — Ambulatory Visit: Payer: Medicare Other | Admitting: Family Medicine

## 2023-10-10 DIAGNOSIS — J449 Chronic obstructive pulmonary disease, unspecified: Secondary | ICD-10-CM | POA: Diagnosis not present

## 2023-10-10 DIAGNOSIS — I10 Essential (primary) hypertension: Secondary | ICD-10-CM | POA: Diagnosis not present

## 2023-10-10 DIAGNOSIS — E785 Hyperlipidemia, unspecified: Secondary | ICD-10-CM | POA: Diagnosis not present

## 2023-10-10 DIAGNOSIS — N1832 Chronic kidney disease, stage 3b: Secondary | ICD-10-CM | POA: Diagnosis not present

## 2023-10-10 DIAGNOSIS — Z79899 Other long term (current) drug therapy: Secondary | ICD-10-CM | POA: Diagnosis not present

## 2023-10-10 DIAGNOSIS — Z0001 Encounter for general adult medical examination with abnormal findings: Secondary | ICD-10-CM | POA: Diagnosis not present

## 2023-10-14 DIAGNOSIS — Z0001 Encounter for general adult medical examination with abnormal findings: Secondary | ICD-10-CM | POA: Diagnosis not present

## 2023-10-14 DIAGNOSIS — E559 Vitamin D deficiency, unspecified: Secondary | ICD-10-CM | POA: Diagnosis not present

## 2023-10-14 DIAGNOSIS — Z79899 Other long term (current) drug therapy: Secondary | ICD-10-CM | POA: Diagnosis not present

## 2023-11-07 DIAGNOSIS — J449 Chronic obstructive pulmonary disease, unspecified: Secondary | ICD-10-CM | POA: Diagnosis not present

## 2023-11-07 DIAGNOSIS — E559 Vitamin D deficiency, unspecified: Secondary | ICD-10-CM | POA: Diagnosis not present

## 2023-11-07 DIAGNOSIS — I10 Essential (primary) hypertension: Secondary | ICD-10-CM | POA: Diagnosis not present

## 2023-12-26 DIAGNOSIS — E785 Hyperlipidemia, unspecified: Secondary | ICD-10-CM | POA: Diagnosis not present

## 2023-12-26 DIAGNOSIS — E559 Vitamin D deficiency, unspecified: Secondary | ICD-10-CM | POA: Diagnosis not present

## 2023-12-26 DIAGNOSIS — J449 Chronic obstructive pulmonary disease, unspecified: Secondary | ICD-10-CM | POA: Diagnosis not present
